# Patient Record
Sex: Male | Born: 1965
Health system: Southern US, Community
[De-identification: ages and names within clinical notes are randomized; demographics above are authoritative.]

## PROBLEM LIST (undated history)

## (undated) DIAGNOSIS — M109 Gout, unspecified: Secondary | ICD-10-CM

## (undated) DIAGNOSIS — G2581 Restless legs syndrome: Secondary | ICD-10-CM

## (undated) DIAGNOSIS — R7989 Other specified abnormal findings of blood chemistry: Secondary | ICD-10-CM

## (undated) DIAGNOSIS — D751 Secondary polycythemia: Secondary | ICD-10-CM

## (undated) DIAGNOSIS — E785 Hyperlipidemia, unspecified: Secondary | ICD-10-CM

## (undated) DIAGNOSIS — K603 Anal fistula, unspecified: Secondary | ICD-10-CM

## (undated) DIAGNOSIS — Z8739 Personal history of other diseases of the musculoskeletal system and connective tissue: Secondary | ICD-10-CM

## (undated) DIAGNOSIS — M722 Plantar fascial fibromatosis: Secondary | ICD-10-CM

## (undated) DIAGNOSIS — E291 Testicular hypofunction: Secondary | ICD-10-CM

## (undated) HISTORY — PX: CHOLECYSTECTOMY: SHX55

## (undated) HISTORY — DX: Hyperlipidemia, unspecified: E78.5

## (undated) HISTORY — DX: Secondary polycythemia: D75.1

---

## 1999-04-20 HISTORY — PX: APPENDECTOMY: SHX54

## 2001-08-08 ENCOUNTER — Ambulatory Visit (HOSPITAL_BASED_OUTPATIENT_CLINIC_OR_DEPARTMENT_OTHER): Admission: RE | Admit: 2001-08-08 | Discharge: 2001-08-08 | Payer: Self-pay | Admitting: Orthopedic Surgery

## 2012-01-24 ENCOUNTER — Emergency Department (HOSPITAL_BASED_OUTPATIENT_CLINIC_OR_DEPARTMENT_OTHER)
Admission: EM | Admit: 2012-01-24 | Discharge: 2012-01-24 | Disposition: A | Discharge status: Home / Self Care | Payer: Worker's Compensation | Attending: Emergency Medicine | Admitting: Emergency Medicine

## 2012-01-24 ENCOUNTER — Emergency Department (HOSPITAL_BASED_OUTPATIENT_CLINIC_OR_DEPARTMENT_OTHER): Payer: Worker's Compensation

## 2012-01-24 ENCOUNTER — Encounter (HOSPITAL_BASED_OUTPATIENT_CLINIC_OR_DEPARTMENT_OTHER): Payer: Self-pay | Admitting: Student

## 2012-01-24 DIAGNOSIS — S0180XA Unspecified open wound of other part of head, initial encounter: Secondary | ICD-10-CM | POA: Insufficient documentation

## 2012-01-24 DIAGNOSIS — Y9289 Other specified places as the place of occurrence of the external cause: Secondary | ICD-10-CM | POA: Insufficient documentation

## 2012-01-24 DIAGNOSIS — Z23 Encounter for immunization: Secondary | ICD-10-CM | POA: Insufficient documentation

## 2012-01-24 DIAGNOSIS — S0181XA Laceration without foreign body of other part of head, initial encounter: Secondary | ICD-10-CM

## 2012-01-24 DIAGNOSIS — T148XXA Other injury of unspecified body region, initial encounter: Secondary | ICD-10-CM

## 2012-01-24 DIAGNOSIS — IMO0002 Reserved for concepts with insufficient information to code with codable children: Secondary | ICD-10-CM | POA: Insufficient documentation

## 2012-01-24 HISTORY — DX: Gout, unspecified: M10.9

## 2012-01-24 MED ORDER — OXYCODONE-ACETAMINOPHEN 5-325 MG PO TABS
1.0000 | ORAL_TABLET | Freq: Once | ORAL | Status: AC
  Administered 2012-01-24: 1 via ORAL
  Filled 2012-01-24 (×2): qty 1

## 2012-01-24 MED ORDER — TETANUS-DIPHTH-ACELL PERTUSSIS 5-2.5-18.5 LF-MCG/0.5 IM SUSP
0.5000 mL | Freq: Once | INTRAMUSCULAR | Status: AC
  Administered 2012-01-24: 0.5 mL via INTRAMUSCULAR
  Filled 2012-01-24: qty 0.5

## 2012-01-24 NOTE — ED Notes (Signed)
Pt reports being at work and getting accidentally hit in the face with a work related object. Has a laceration approximately 1/2 in in length with no active bleeding. Denies visual problems at this time. Neuro intact.

## 2012-01-24 NOTE — ED Notes (Addendum)
[employer] - [employer] UDS performed. Pt reports being hit in head by work machinery, no loc. Skin abrasion noted at upper right eyebrow

## 2012-01-24 NOTE — ED Provider Notes (Addendum)
History     CSN: [id number]  Arrival date & time 01/24/12  1016   First MD Initiated Contact with Patient 01/24/12 1056      Chief Complaint  Patient presents with  . Abrasion    (Consider location/radiation/quality/duration/timing/severity/associated sxs/prior treatment) Patient is a 46 y.o. male presenting with facial injury. The history is provided by the patient.  Facial Injury  The incident occurred just prior to arrival. The injury mechanism was a direct blow. 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 No protective equipment was used. He came to the ER via personal transport. There is an injury to the face. The pain is severe. It is unlikely that a foreign body is present. Associated symptoms include nausea and headaches. Pertinent negatives include no visual disturbance, no vomiting, no light-headedness and no loss of consciousness. There have been no prior injuries to these areas. His tetanus status is unknown. He has been behaving normally. There were no sick contacts. He has received no recent medical care.    Past Medical History  Diagnosis Date  . Gout     No past surgical history on file.  No family history on file.  History  Substance Use Topics  . Smoking status: Never Smoker   . Smokeless tobacco: Not on file  . Alcohol Use: No      Review of Systems  Eyes: Negative for visual disturbance.  Gastrointestinal: Positive for nausea. Negative for vomiting.  Neurological: Positive for headaches. Negative for loss of consciousness and light-headedness.  All other systems reviewed and are negative.    Allergies  Penicillins  Home Medications  No current outpatient prescriptions on file.  There were no vitals taken for this visit.  Physical Exam  Nursing note and vitals reviewed. Constitutional: He is oriented to person, place, and time. He appears well-developed and well-nourished. No distress.  HENT:  Head:  Normocephalic. Head is with contusion and with laceration. Head is without right periorbital erythema.    Mouth/Throat: Oropharynx is clear and moist.  Eyes: Conjunctivae normal and EOM are normal. Pupils are equal, round, and reactive to light.       Normal vision  Neck: Normal range of motion. Neck supple. No spinous process tenderness and no muscular tenderness present.  Cardiovascular: Normal rate, regular rhythm and intact distal pulses.   No murmur heard. Pulmonary/Chest: Effort normal and breath sounds normal. No respiratory distress. He has no wheezes. He has no rales.  Musculoskeletal: Normal range of motion. He exhibits no edema and no tenderness.  Neurological: He is alert and oriented to person, place, and time.  Skin: Skin is warm and dry. No rash noted. No erythema.  Psychiatric: He has a normal mood and affect. His behavior is normal.    ED Course  Procedures (including critical care time)  Labs Reviewed - No data to display Ct Head Wo Contrast  01/24/2012  *RADIOLOGY REPORT*  Clinical Data:  Head trauma, dizziness, blurred vision, nausea, struck right supraorbital  CT HEAD WITHOUT CONTRAST CT MAXILLOFACIAL WITHOUT CONTRAST  Technique:  Multidetector CT imaging of the head and maxillofacial structures were performed using the standard protocol without intravenous contrast. Multiplanar CT image reconstructions of the maxillofacial structures were also generated.  Comparison:  None  CT HEAD  Findings: Normal ventricular morphology. No midline shift or mass effect. Normal appearance of brain parenchyma. No intracranial hemorrhage, mass lesion or evidence of acute infarction. Right supraorbital scalp hematoma. No  extra-axial fluid collections or fractures identified.  IMPRESSION: No acute intracranial abnormalities.  CT MAXILLOFACIAL  Findings: No markers on right side of face. Visualized intracranial structures unremarkable. Intraorbital soft tissue planes clear. Right periorbital  and supraorbital scalp hematoma. Paranasal sinuses, mastoid air cells and middle ear cavities clear. Nasal septal deviation to the left. No facial bone or calvarial fracture identified.  IMPRESSION: No acute osseous abnormalities.   Original Report Authenticated By: Lollie Marrow, M.D.    Ct Maxillofacial Wo Cm  01/24/2012  *RADIOLOGY REPORT*  Clinical Data:  Head trauma, dizziness, blurred vision, nausea, struck right supraorbital  CT HEAD WITHOUT CONTRAST CT MAXILLOFACIAL WITHOUT CONTRAST  Technique:  Multidetector CT imaging of the head and maxillofacial structures were performed using the standard protocol without intravenous contrast. Multiplanar CT image reconstructions of the maxillofacial structures were also generated.  Comparison:  None  CT HEAD  Findings: Normal ventricular morphology. No midline shift or mass effect. Normal appearance of brain parenchyma. No intracranial hemorrhage, mass lesion or evidence of acute infarction. Right supraorbital scalp hematoma. No extra-axial fluid collections or fractures identified.  IMPRESSION: No acute intracranial abnormalities.  CT MAXILLOFACIAL  Findings: No markers on right side of face. Visualized intracranial structures unremarkable. Intraorbital soft tissue planes clear. Right periorbital and supraorbital scalp hematoma. Paranasal sinuses, mastoid air cells and middle ear cavities clear. Nasal septal deviation to the left. No facial bone or calvarial fracture identified.  IMPRESSION: No acute osseous abnormalities.   Original Report Authenticated By: Lollie Marrow, M.D.    LACERATION REPAIR Performed by: Gwyneth Sprout Authorized byGwyneth Sprout Consent: Verbal consent obtained. Risks and benefits: risks, benefits and alternatives were discussed Consent given by: patient Patient identity confirmed: provided demographic data Prepped and Draped in normal sterile fashion Wound explored  Laceration Location: Right eyebrow  Laceration  Length: 1 cm  No Foreign Bodies seen or palpated  Anesthesia: None Irrigation method: And scrubbed with tap water  Amount of cleaning: standard  Skin closure: Dermabond Number of sutures0  Technique: Dermabond   Patient tolerance: Patient tolerated the procedure well with no immediate complications.    1. Facial laceration   2. Contusion       MDM   Patient with an injury to the superior orbital rim after being hit by a piece of equipment at work. He had no LOC did not pass out.  He has had some persistent nausea but denies any visual acuity changes. There is a superficial lack over the right eyebrow. No signs of extraocular muscle entrapment. Tetanus updated. CT of the head and face pending to rule out fracture or underlying bleed. Patient is currently not on anticoagulation.  12:29 PM Films are negative. Wound repaired and patient discharged home.         Gwyneth Sprout, MD 01/24/12 1229  Gwyneth Sprout, MD 01/24/12 1230

## 2014-07-08 ENCOUNTER — Ambulatory Visit (INDEPENDENT_AMBULATORY_CARE_PROVIDER_SITE_OTHER): Payer: 59

## 2014-07-08 VITALS — BP 139/87 | HR 83 | Resp 18

## 2014-07-08 DIAGNOSIS — M79675 Pain in left toe(s): Secondary | ICD-10-CM

## 2014-07-08 DIAGNOSIS — L03032 Cellulitis of left toe: Secondary | ICD-10-CM

## 2014-07-08 DIAGNOSIS — L600 Ingrowing nail: Secondary | ICD-10-CM

## 2014-07-08 MED ORDER — CLINDAMYCIN HCL 150 MG PO CAPS: 150.0000 mg | ORAL_CAPSULE | Freq: Four times a day (QID) | ORAL | Status: DC

## 2014-07-08 NOTE — Patient Instructions (Signed)
Betadine Soak Instructions  Purchase an 8 oz. bottle of BETADINE solution (Povidone)  THE DAY AFTER THE PROCEDURE  Place 1 tablespoon of betadine solution in a quart of warm tap water.  Submerge your foot or feet with outer bandage intact for the initial soak; this will allow the bandage to become moist and wet for easy lift off.  Once you remove your bandage, continue to soak in the solution for 20 minutes.  This soak should be done twice a day.  Next, remove your foot or feet from solution, blot dry the affected area and cover.  You may use a band aid large enough to cover the area or use gauze and tape.  Apply other medications to the area as directed by the doctor such as cortisporin otic solution (ear drops) or neosporin.  IF YOUR SKIN BECOMES IRRITATED WHILE USING THESE INSTRUCTIONS, IT IS OKAY TO SWITCH TO EPSOM SALTS AND WATER OR WHITE VINEGAR AND WATER.  Recommend Tylenol or Advil as needed for pain 2 Tylenol or 2 Advil up to 3 times a day should manage the pain

## 2014-07-08 NOTE — Progress Notes (Signed)
   Subjective:    Patient ID: Joshua [name], male    DOB: 09/20/1965, 49 y.o.   MRN: [id number]  HPI I HAVE AN INGROWN TOENAIL ON MY LEFT BIG TOE AND HAS BEEN GOING ON FOR ABOUT 2 MONTHS AND HAS TRIMMED ON IT AND TOUCHY AND SMELLS AND HAS SOME DRAINING AND IS SORE AND TENDER AND I DRIVE A TRUCK    Review of Systems  All other systems reviewed and are negative.      Objective:   Physical Exam 49 year old white male well-developed well-nourished oriented 3 presents this time with long-standing history of ingrowing nail lateral border of the left great toe maintain correction nail procedure on the medial border sometime in the past. Has a repeated recurrent ingrowing nails on both of his toes however the lateral left is not painful symptomatically for more than 2 months there is some yellow discharge and drainage granulation tissue and edema the lateral nail fold. No ascending cellulitis or lymphangitis pedal pulses are palpable DP and PT +2 over 4 capillary fill time 3 seconds epicritic and proprioceptive sensations intact and symmetric bilateral there is normal plantar response and DTRs. Dermatologic the skin color pigment normal hair growth absent diminished distally there is slight rash his medial ankle yesterday about identifying skin cancers may just be some actinic keratosis however patient does have ingrowing nail left great toe some proptosis incurvation of the right hallux although a symptomatically the current time. Orthopedic biomechanical exam unremarkable rectus foot mild flexible digital contractures are noted no open wounds no ulcers no secondary infections the current time.       Assessment & Plan:  Assessment paronychia ingrowing nail left great toe lateral border plan at this time her condition permanent nail excision with phenol matricectomy local block diminished total of 3 mL 50-50 mix of 2% Xylocaine plain and 0.5 Marcaine plain the left great toe Betadine prep was performed a  lateral border is excised phenol matricectomy followed by alcohol wash Betadine ointment and a or Silvadene and a gauze dressing being applied to the left great toe. Patient is instructed in daily soaking Betadine warm water prescription for clean the mycin is issued time also pursue recommend Tylenol or Advil as needed for pain recheck in 2-3 weeks for follow-up and nail check.  Alvan Dameichard Camdyn Laden DPM

## 2014-07-22 ENCOUNTER — Ambulatory Visit: Payer: 59

## 2016-05-10 DIAGNOSIS — G4700 Insomnia, unspecified: Secondary | ICD-10-CM | POA: Diagnosis not present

## 2016-05-10 DIAGNOSIS — M109 Gout, unspecified: Secondary | ICD-10-CM | POA: Diagnosis not present

## 2016-05-10 DIAGNOSIS — E291 Testicular hypofunction: Secondary | ICD-10-CM | POA: Diagnosis not present

## 2016-08-02 DIAGNOSIS — R05 Cough: Secondary | ICD-10-CM | POA: Diagnosis not present

## 2016-12-06 DIAGNOSIS — Z0000 Encounter for general adult medical examination without abnormal findings: Secondary | ICD-10-CM | POA: Diagnosis not present

## 2016-12-06 DIAGNOSIS — Z23 Encounter for immunization: Secondary | ICD-10-CM | POA: Diagnosis not present

## 2016-12-06 DIAGNOSIS — G4700 Insomnia, unspecified: Secondary | ICD-10-CM | POA: Diagnosis not present

## 2016-12-06 DIAGNOSIS — M109 Gout, unspecified: Secondary | ICD-10-CM | POA: Diagnosis not present

## 2016-12-06 DIAGNOSIS — E291 Testicular hypofunction: Secondary | ICD-10-CM | POA: Diagnosis not present

## 2017-01-17 DIAGNOSIS — Z1211 Encounter for screening for malignant neoplasm of colon: Secondary | ICD-10-CM | POA: Diagnosis not present

## 2017-02-14 DIAGNOSIS — Z1211 Encounter for screening for malignant neoplasm of colon: Secondary | ICD-10-CM | POA: Diagnosis not present

## 2017-02-14 DIAGNOSIS — K6289 Other specified diseases of anus and rectum: Secondary | ICD-10-CM | POA: Diagnosis not present

## 2017-02-14 DIAGNOSIS — K635 Polyp of colon: Secondary | ICD-10-CM | POA: Diagnosis not present

## 2017-02-28 DIAGNOSIS — J329 Chronic sinusitis, unspecified: Secondary | ICD-10-CM | POA: Diagnosis not present

## 2017-02-28 DIAGNOSIS — E291 Testicular hypofunction: Secondary | ICD-10-CM | POA: Diagnosis not present

## 2017-02-28 DIAGNOSIS — Z23 Encounter for immunization: Secondary | ICD-10-CM | POA: Diagnosis not present

## 2017-05-24 DIAGNOSIS — R0789 Other chest pain: Secondary | ICD-10-CM | POA: Diagnosis not present

## 2017-05-24 DIAGNOSIS — E291 Testicular hypofunction: Secondary | ICD-10-CM | POA: Diagnosis not present

## 2017-08-15 DIAGNOSIS — R300 Dysuria: Secondary | ICD-10-CM | POA: Diagnosis not present

## 2017-10-10 DIAGNOSIS — E291 Testicular hypofunction: Secondary | ICD-10-CM | POA: Diagnosis not present

## 2017-10-10 DIAGNOSIS — R8290 Unspecified abnormal findings in urine: Secondary | ICD-10-CM | POA: Diagnosis not present

## 2017-10-10 DIAGNOSIS — R300 Dysuria: Secondary | ICD-10-CM | POA: Diagnosis not present

## 2017-10-10 DIAGNOSIS — N481 Balanitis: Secondary | ICD-10-CM | POA: Diagnosis not present

## 2017-10-25 DIAGNOSIS — R7989 Other specified abnormal findings of blood chemistry: Secondary | ICD-10-CM | POA: Diagnosis not present

## 2017-10-25 DIAGNOSIS — R361 Hematospermia: Secondary | ICD-10-CM | POA: Diagnosis not present

## 2017-10-25 DIAGNOSIS — Z136 Encounter for screening for cardiovascular disorders: Secondary | ICD-10-CM | POA: Diagnosis not present

## 2017-10-25 DIAGNOSIS — R319 Hematuria, unspecified: Secondary | ICD-10-CM | POA: Diagnosis not present

## 2017-10-25 DIAGNOSIS — Z5181 Encounter for therapeutic drug level monitoring: Secondary | ICD-10-CM | POA: Diagnosis not present

## 2018-01-02 DIAGNOSIS — R05 Cough: Secondary | ICD-10-CM | POA: Diagnosis not present

## 2018-01-02 DIAGNOSIS — E291 Testicular hypofunction: Secondary | ICD-10-CM | POA: Diagnosis not present

## 2018-01-02 DIAGNOSIS — G4700 Insomnia, unspecified: Secondary | ICD-10-CM | POA: Diagnosis not present

## 2018-06-16 DIAGNOSIS — E669 Obesity, unspecified: Secondary | ICD-10-CM | POA: Diagnosis not present

## 2018-06-16 DIAGNOSIS — R05 Cough: Secondary | ICD-10-CM | POA: Diagnosis not present

## 2018-06-16 DIAGNOSIS — J069 Acute upper respiratory infection, unspecified: Secondary | ICD-10-CM | POA: Diagnosis not present

## 2018-06-16 DIAGNOSIS — E291 Testicular hypofunction: Secondary | ICD-10-CM | POA: Diagnosis not present

## 2019-06-28 ENCOUNTER — Encounter (HOSPITAL_COMMUNITY)
Admission: EM | Disposition: A | Discharge status: Home / Self Care | Payer: Self-pay | Source: Home / Self Care | Attending: Emergency Medicine

## 2019-06-28 ENCOUNTER — Observation Stay (HOSPITAL_COMMUNITY): Payer: 59 | Admitting: Registered Nurse

## 2019-06-28 ENCOUNTER — Observation Stay (HOSPITAL_COMMUNITY)
Admission: EM | Admit: 2019-06-28 | Discharge: 2019-06-29 | Disposition: A | Discharge status: Home / Self Care | Payer: 59 | Attending: General Surgery | Admitting: General Surgery

## 2019-06-28 ENCOUNTER — Encounter (HOSPITAL_COMMUNITY): Payer: Self-pay

## 2019-06-28 ENCOUNTER — Emergency Department (HOSPITAL_COMMUNITY): Payer: 59

## 2019-06-28 ENCOUNTER — Other Ambulatory Visit: Payer: Self-pay

## 2019-06-28 DIAGNOSIS — I700 Atherosclerosis of aorta: Secondary | ICD-10-CM | POA: Diagnosis not present

## 2019-06-28 DIAGNOSIS — K611 Rectal abscess: Secondary | ICD-10-CM | POA: Diagnosis present

## 2019-06-28 DIAGNOSIS — Z9049 Acquired absence of other specified parts of digestive tract: Secondary | ICD-10-CM | POA: Insufficient documentation

## 2019-06-28 DIAGNOSIS — N200 Calculus of kidney: Secondary | ICD-10-CM | POA: Diagnosis not present

## 2019-06-28 DIAGNOSIS — Z79899 Other long term (current) drug therapy: Secondary | ICD-10-CM | POA: Insufficient documentation

## 2019-06-28 DIAGNOSIS — K760 Fatty (change of) liver, not elsewhere classified: Secondary | ICD-10-CM | POA: Insufficient documentation

## 2019-06-28 DIAGNOSIS — Z20822 Contact with and (suspected) exposure to covid-19: Secondary | ICD-10-CM | POA: Insufficient documentation

## 2019-06-28 DIAGNOSIS — K610 Anal abscess: Secondary | ICD-10-CM | POA: Diagnosis present

## 2019-06-28 DIAGNOSIS — J984 Other disorders of lung: Secondary | ICD-10-CM | POA: Insufficient documentation

## 2019-06-28 DIAGNOSIS — K612 Anorectal abscess: Principal | ICD-10-CM | POA: Insufficient documentation

## 2019-06-28 DIAGNOSIS — M109 Gout, unspecified: Secondary | ICD-10-CM | POA: Diagnosis not present

## 2019-06-28 DIAGNOSIS — Z6837 Body mass index (BMI) 37.0-37.9, adult: Secondary | ICD-10-CM | POA: Diagnosis not present

## 2019-06-28 DIAGNOSIS — Z880 Allergy status to penicillin: Secondary | ICD-10-CM | POA: Diagnosis not present

## 2019-06-28 DIAGNOSIS — F17210 Nicotine dependence, cigarettes, uncomplicated: Secondary | ICD-10-CM | POA: Diagnosis not present

## 2019-06-28 HISTORY — DX: Rectal abscess: K61.1

## 2019-06-28 HISTORY — DX: Other specified abnormal findings of blood chemistry: R79.89

## 2019-06-28 HISTORY — PX: INCISION AND DRAINAGE PERIRECTAL ABSCESS: SHX1804

## 2019-06-28 LAB — RESPIRATORY PANEL BY RT PCR (FLU A&B, COVID)
Influenza A by PCR: NEGATIVE
Influenza B by PCR: NEGATIVE
SARS Coronavirus 2 by RT PCR: NEGATIVE

## 2019-06-28 LAB — CBC WITH DIFFERENTIAL/PLATELET
Abs Immature Granulocytes: 0.1 10*3/uL — ABNORMAL HIGH (ref 0.00–0.07)
Basophils Absolute: 0.1 10*3/uL (ref 0.0–0.1)
Basophils Relative: 0 %
Eosinophils Absolute: 0.1 10*3/uL (ref 0.0–0.5)
Eosinophils Relative: 1 %
HCT: 50.8 % (ref 39.0–52.0)
Hemoglobin: 17.1 g/dL — ABNORMAL HIGH (ref 13.0–17.0)
Immature Granulocytes: 1 %
Lymphocytes Relative: 8 %
Lymphs Abs: 1.2 10*3/uL (ref 0.7–4.0)
MCH: 31.8 pg (ref 26.0–34.0)
MCHC: 33.7 g/dL (ref 30.0–36.0)
MCV: 94.6 fL (ref 80.0–100.0)
Monocytes Absolute: 1.4 10*3/uL — ABNORMAL HIGH (ref 0.1–1.0)
Monocytes Relative: 10 %
Neutro Abs: 12 10*3/uL — ABNORMAL HIGH (ref 1.7–7.7)
Neutrophils Relative %: 80 %
Platelets: 130 10*3/uL — ABNORMAL LOW (ref 150–400)
RBC: 5.37 MIL/uL (ref 4.22–5.81)
RDW: 13.2 % (ref 11.5–15.5)
WBC: 14.9 10*3/uL — ABNORMAL HIGH (ref 4.0–10.5)
nRBC: 0 % (ref 0.0–0.2)

## 2019-06-28 LAB — BASIC METABOLIC PANEL
Anion gap: 9 (ref 5–15)
BUN: 16 mg/dL (ref 6–20)
CO2: 23 mmol/L (ref 22–32)
Calcium: 8.6 mg/dL — ABNORMAL LOW (ref 8.9–10.3)
Chloride: 105 mmol/L (ref 98–111)
Creatinine, Ser: 1.06 mg/dL (ref 0.61–1.24)
GFR calc Af Amer: >60 mL/min (ref >60)
GFR calc non Af Amer: >60 mL/min (ref >60)
Glucose, Bld: 99 mg/dL (ref 70–99)
Potassium: 4.1 mmol/L (ref 3.5–5.1)
Sodium: 137 mmol/L (ref 135–145)

## 2019-06-28 LAB — HIV ANTIBODY (ROUTINE TESTING W REFLEX): HIV Screen 4th Generation wRfx: NONREACTIVE

## 2019-06-28 SURGERY — EXAM UNDER ANESTHESIA
Anesthesia: General

## 2019-06-28 MED ORDER — DIPHENHYDRAMINE HCL 25 MG PO CAPS: 25.0000 mg | ORAL_CAPSULE | Freq: Four times a day (QID) | ORAL | Status: DC | PRN

## 2019-06-28 MED ORDER — SODIUM CHLORIDE (PF) 0.9 % IJ SOLN
INTRAMUSCULAR | Status: AC
  Filled 2019-06-28: qty 50

## 2019-06-28 MED ORDER — OXYCODONE HCL 5 MG/5ML PO SOLN: 5.0000 mg | Freq: Once | ORAL | Status: DC | PRN

## 2019-06-28 MED ORDER — CLINDAMYCIN PHOSPHATE 600 MG/50ML IV SOLN
600.0000 mg | Freq: Three times a day (TID) | INTRAVENOUS | Status: DC
  Filled 2019-06-28: qty 50

## 2019-06-28 MED ORDER — DEXTROSE-NACL 5-0.45 % IV SOLN: INTRAVENOUS | Status: DC

## 2019-06-28 MED ORDER — ACETAMINOPHEN 500 MG PO TABS
1000.0000 mg | ORAL_TABLET | ORAL | Status: AC
  Administered 2019-06-28: 1000 mg via ORAL
  Filled 2019-06-28: qty 2

## 2019-06-28 MED ORDER — CLINDAMYCIN PHOSPHATE 900 MG/50ML IV SOLN
900.0000 mg | Freq: Three times a day (TID) | INTRAVENOUS | Status: DC
  Administered 2019-06-28 – 2019-06-29 (×2): 900 mg via INTRAVENOUS
  Filled 2019-06-28 (×3): qty 50

## 2019-06-28 MED ORDER — FENTANYL CITRATE (PF) 100 MCG/2ML IJ SOLN: 25.0000 ug | INTRAMUSCULAR | Status: DC | PRN

## 2019-06-28 MED ORDER — LIP MEDEX EX OINT
1.0000 "application " | TOPICAL_OINTMENT | Freq: Two times a day (BID) | CUTANEOUS | Status: DC
  Administered 2019-06-28 – 2019-06-29 (×2): 1 via TOPICAL
  Filled 2019-06-28: qty 7

## 2019-06-28 MED ORDER — PROPOFOL 500 MG/50ML IV EMUL
INTRAVENOUS | Status: DC | PRN
  Administered 2019-06-28: 200 mg via INTRAVENOUS

## 2019-06-28 MED ORDER — CLINDAMYCIN PHOSPHATE 900 MG/50ML IV SOLN
900.0000 mg | Freq: Four times a day (QID) | INTRAVENOUS | Status: DC
  Filled 2019-06-28 (×2): qty 50

## 2019-06-28 MED ORDER — CLINDAMYCIN PHOSPHATE 600 MG/50ML IV SOLN
600.0000 mg | Freq: Once | INTRAVENOUS | Status: AC
  Administered 2019-06-28: 600 mg via INTRAVENOUS
  Filled 2019-06-28: qty 50

## 2019-06-28 MED ORDER — LACTATED RINGERS IV SOLN: INTRAVENOUS | Status: DC

## 2019-06-28 MED ORDER — GENTAMICIN SULFATE 40 MG/ML IJ SOLN
7.0000 mg/kg | INTRAVENOUS | Status: DC
  Administered 2019-06-28: 640 mg via INTRAVENOUS
  Filled 2019-06-28 (×2): qty 16

## 2019-06-28 MED ORDER — 0.9 % SODIUM CHLORIDE (POUR BTL) OPTIME
TOPICAL | Status: DC | PRN
  Administered 2019-06-28: 1000 mL

## 2019-06-28 MED ORDER — ALLOPURINOL 100 MG PO TABS
100.0000 mg | ORAL_TABLET | Freq: Every day | ORAL | Status: DC
  Administered 2019-06-28: 100 mg via ORAL
  Filled 2019-06-28 (×2): qty 1

## 2019-06-28 MED ORDER — MIDAZOLAM HCL 2 MG/2ML IJ SOLN
INTRAMUSCULAR | Status: DC | PRN
  Administered 2019-06-28: 2 mg via INTRAVENOUS

## 2019-06-28 MED ORDER — FENTANYL CITRATE (PF) 250 MCG/5ML IJ SOLN
INTRAMUSCULAR | Status: AC
  Filled 2019-06-28: qty 5

## 2019-06-28 MED ORDER — PROPOFOL 10 MG/ML IV BOLUS
INTRAVENOUS | Status: AC
  Filled 2019-06-28: qty 40

## 2019-06-28 MED ORDER — TRAZODONE HCL 100 MG PO TABS
150.0000 mg | ORAL_TABLET | Freq: Every day | ORAL | Status: DC
  Administered 2019-06-28: 150 mg via ORAL
  Filled 2019-06-28: qty 1

## 2019-06-28 MED ORDER — ENOXAPARIN SODIUM 40 MG/0.4ML ~~LOC~~ SOLN: 40.0000 mg | SUBCUTANEOUS | Status: DC

## 2019-06-28 MED ORDER — SODIUM CHLORIDE 0.9 % IV SOLN: 20.0000 mL | Freq: Once | INTRAVENOUS | Status: DC

## 2019-06-28 MED ORDER — BUPIVACAINE LIPOSOME 1.3 % IJ SUSP
INTRAMUSCULAR | Status: DC | PRN
  Administered 2019-06-28: 20 mL

## 2019-06-28 MED ORDER — DIPHENHYDRAMINE HCL 50 MG/ML IJ SOLN: 25.0000 mg | Freq: Four times a day (QID) | INTRAMUSCULAR | Status: DC | PRN

## 2019-06-28 MED ORDER — IOHEXOL 300 MG/ML  SOLN
100.0000 mL | Freq: Once | INTRAMUSCULAR | Status: AC | PRN
  Administered 2019-06-28: 100 mL via INTRAVENOUS

## 2019-06-28 MED ORDER — PHENOL 1.4 % MT LIQD: 2.0000 | OROMUCOSAL | Status: DC | PRN

## 2019-06-28 MED ORDER — METHOCARBAMOL 1000 MG/10ML IJ SOLN
1000.0000 mg | Freq: Four times a day (QID) | INTRAVENOUS | Status: DC | PRN
  Filled 2019-06-28: qty 10

## 2019-06-28 MED ORDER — LACTATED RINGERS IV BOLUS: 1000.0000 mL | Freq: Three times a day (TID) | INTRAVENOUS | Status: DC | PRN

## 2019-06-28 MED ORDER — ONDANSETRON HCL 4 MG/2ML IJ SOLN: 4.0000 mg | Freq: Four times a day (QID) | INTRAMUSCULAR | Status: DC | PRN

## 2019-06-28 MED ORDER — BUPIVACAINE-EPINEPHRINE (PF) 0.25% -1:200000 IJ SOLN
INTRAMUSCULAR | Status: AC
  Filled 2019-06-28: qty 30

## 2019-06-28 MED ORDER — DOCUSATE SODIUM 100 MG PO CAPS
100.0000 mg | ORAL_CAPSULE | Freq: Two times a day (BID) | ORAL | Status: DC
  Administered 2019-06-28 – 2019-06-29 (×2): 100 mg via ORAL
  Filled 2019-06-28 (×2): qty 1

## 2019-06-28 MED ORDER — GENTAMICIN IN SALINE 1-0.9 MG/ML-% IV SOLN
100.0000 mg | INTRAVENOUS | Status: AC
  Administered 2019-06-28: 100 mg via INTRAVENOUS
  Filled 2019-06-28: qty 100

## 2019-06-28 MED ORDER — OXYCODONE HCL 5 MG PO TABS
5.0000 mg | ORAL_TABLET | ORAL | Status: DC | PRN
  Administered 2019-06-29: 10 mg via ORAL
  Filled 2019-06-28: qty 2

## 2019-06-28 MED ORDER — MORPHINE SULFATE (PF) 4 MG/ML IV SOLN
4.0000 mg | Freq: Once | INTRAVENOUS | Status: AC
  Administered 2019-06-28: 4 mg via INTRAVENOUS
  Filled 2019-06-28: qty 1

## 2019-06-28 MED ORDER — ALUM & MAG HYDROXIDE-SIMETH 200-200-20 MG/5ML PO SUSP: 30.0000 mL | Freq: Four times a day (QID) | ORAL | Status: DC | PRN

## 2019-06-28 MED ORDER — ONDANSETRON HCL 4 MG/2ML IJ SOLN
INTRAMUSCULAR | Status: DC | PRN
  Administered 2019-06-28: 4 mg via INTRAVENOUS

## 2019-06-28 MED ORDER — IBUPROFEN 400 MG PO TABS: 800.0000 mg | ORAL_TABLET | Freq: Four times a day (QID) | ORAL | Status: DC | PRN

## 2019-06-28 MED ORDER — MIDAZOLAM HCL 2 MG/2ML IJ SOLN
INTRAMUSCULAR | Status: AC
  Filled 2019-06-28: qty 2

## 2019-06-28 MED ORDER — ACETAMINOPHEN 500 MG PO TABS
1000.0000 mg | ORAL_TABLET | Freq: Four times a day (QID) | ORAL | Status: DC
  Administered 2019-06-28 – 2019-06-29 (×2): 1000 mg via ORAL
  Filled 2019-06-28 (×2): qty 2

## 2019-06-28 MED ORDER — OXYCODONE HCL 5 MG PO TABS: 5.0000 mg | ORAL_TABLET | Freq: Once | ORAL | Status: DC | PRN

## 2019-06-28 MED ORDER — MENTHOL 3 MG MT LOZG: 1.0000 | LOZENGE | OROMUCOSAL | Status: DC | PRN

## 2019-06-28 MED ORDER — BUPIVACAINE-EPINEPHRINE 0.25% -1:200000 IJ SOLN
INTRAMUSCULAR | Status: DC | PRN
  Administered 2019-06-28: 20 mL

## 2019-06-28 MED ORDER — DEXAMETHASONE SODIUM PHOSPHATE 10 MG/ML IJ SOLN
INTRAMUSCULAR | Status: DC | PRN
  Administered 2019-06-28: 10 mg via INTRAVENOUS

## 2019-06-28 MED ORDER — ACETAMINOPHEN 10 MG/ML IV SOLN: 1000.0000 mg | Freq: Once | INTRAVENOUS | Status: DC | PRN

## 2019-06-28 MED ORDER — LIDOCAINE HCL (PF) 1 % IJ SOLN
30.0000 mL | Freq: Once | INTRAMUSCULAR | Status: AC
  Administered 2019-06-28: 30 mL
  Filled 2019-06-28: qty 30

## 2019-06-28 MED ORDER — ONDANSETRON HCL 4 MG/2ML IJ SOLN
4.0000 mg | Freq: Once | INTRAMUSCULAR | Status: AC
  Administered 2019-06-28: 4 mg via INTRAVENOUS
  Filled 2019-06-28: qty 2

## 2019-06-28 MED ORDER — METOPROLOL TARTRATE 5 MG/5ML IV SOLN: 5.0000 mg | Freq: Four times a day (QID) | INTRAVENOUS | Status: DC | PRN

## 2019-06-28 MED ORDER — SIMETHICONE 40 MG/0.6ML PO SUSP
40.0000 mg | Freq: Four times a day (QID) | ORAL | Status: DC | PRN
  Filled 2019-06-28: qty 0.6

## 2019-06-28 MED ORDER — BUPIVACAINE LIPOSOME 1.3 % IJ SUSP
20.0000 mL | Freq: Once | INTRAMUSCULAR | Status: DC
  Filled 2019-06-28: qty 20

## 2019-06-28 MED ORDER — GABAPENTIN 300 MG PO CAPS
300.0000 mg | ORAL_CAPSULE | ORAL | Status: AC
  Administered 2019-06-28: 300 mg via ORAL
  Filled 2019-06-28: qty 1

## 2019-06-28 MED ORDER — PROMETHAZINE HCL 25 MG/ML IJ SOLN: 6.2500 mg | INTRAMUSCULAR | Status: DC | PRN

## 2019-06-28 MED ORDER — POLYETHYLENE GLYCOL 3350 17 G PO PACK
17.0000 g | PACK | Freq: Every day | ORAL | Status: DC
  Administered 2019-06-29: 17 g via ORAL
  Filled 2019-06-28: qty 1

## 2019-06-28 MED ORDER — LIDOCAINE 2% (20 MG/ML) 5 ML SYRINGE
INTRAMUSCULAR | Status: DC | PRN
  Administered 2019-06-28: 100 mg via INTRAVENOUS

## 2019-06-28 MED ORDER — MAGIC MOUTHWASH
15.0000 mL | Freq: Four times a day (QID) | ORAL | Status: DC | PRN
  Filled 2019-06-28: qty 15

## 2019-06-28 MED ORDER — NAPROXEN 250 MG PO TABS
500.0000 mg | ORAL_TABLET | Freq: Two times a day (BID) | ORAL | Status: DC
  Administered 2019-06-29: 500 mg via ORAL
  Filled 2019-06-28: qty 2

## 2019-06-28 MED ORDER — FENTANYL CITRATE (PF) 100 MCG/2ML IJ SOLN
INTRAMUSCULAR | Status: DC | PRN
  Administered 2019-06-28 (×5): 50 ug via INTRAVENOUS

## 2019-06-28 MED ORDER — ONDANSETRON HCL 4 MG/2ML IJ SOLN
INTRAMUSCULAR | Status: AC
  Filled 2019-06-28: qty 2

## 2019-06-28 MED ORDER — ONDANSETRON 4 MG PO TBDP: 4.0000 mg | ORAL_TABLET | Freq: Four times a day (QID) | ORAL | Status: DC | PRN

## 2019-06-28 MED ORDER — ACETAMINOPHEN 325 MG PO TABS: 325.0000 mg | ORAL_TABLET | Freq: Four times a day (QID) | ORAL | Status: DC | PRN

## 2019-06-28 SURGICAL SUPPLY — 44 items
APL SKNCLS STERI-STRIP NONHPOA (GAUZE/BANDAGES/DRESSINGS) ×1
BENZOIN TINCTURE PRP APPL 2/3 (GAUZE/BANDAGES/DRESSINGS) ×2 IMPLANT
BLADE SURG 15 STRL LF DISP TIS (BLADE) ×1 IMPLANT
BLADE SURG 15 STRL SS (BLADE) ×2
BRIEF STRETCH FOR OB PAD LRG (UNDERPADS AND DIAPERS) ×2 IMPLANT
CNTNR URN SCR LID CUP LEK RST (MISCELLANEOUS) ×1 IMPLANT
CONT SPEC 4OZ STRL OR WHT (MISCELLANEOUS) ×2
COVER SURGICAL LIGHT HANDLE (MISCELLANEOUS) ×2 IMPLANT
COVER WAND RF STERILE (DRAPES) IMPLANT
DECANTER SPIKE VIAL GLASS SM (MISCELLANEOUS) ×2 IMPLANT
DRAPE LAPAROTOMY T 102X78X121 (DRAPES) ×2 IMPLANT
DRSG PAD ABDOMINAL 8X10 ST (GAUZE/BANDAGES/DRESSINGS) ×2 IMPLANT
ELECT REM PT RETURN 15FT ADLT (MISCELLANEOUS) ×2 IMPLANT
GAUZE 4X4 16PLY RFD (DISPOSABLE) ×2 IMPLANT
GAUZE PACKING IODOFORM 1/2 (PACKING) ×1 IMPLANT
GAUZE SPONGE 4X4 12PLY STRL (GAUZE/BANDAGES/DRESSINGS) ×2 IMPLANT
GLOVE ECLIPSE 8.0 STRL XLNG CF (GLOVE) ×2 IMPLANT
GLOVE INDICATOR 8.0 STRL GRN (GLOVE) ×2 IMPLANT
GOWN STRL REUS W/TWL XL LVL3 (GOWN DISPOSABLE) ×4 IMPLANT
KIT BASIN OR (CUSTOM PROCEDURE TRAY) ×2 IMPLANT
KIT TURNOVER KIT A (KITS) IMPLANT
LOOP VESSEL MAXI BLUE (MISCELLANEOUS) IMPLANT
NDL SAFETY ECLIPSE 18X1.5 (NEEDLE) IMPLANT
NEEDLE HYPO 18GX1.5 SHARP (NEEDLE) ×4
NEEDLE HYPO 22GX1.5 SAFETY (NEEDLE) ×2 IMPLANT
PACK BASIC VI WITH GOWN DISP (CUSTOM PROCEDURE TRAY) ×2 IMPLANT
PENCIL SMOKE EVACUATOR (MISCELLANEOUS) IMPLANT
SHEARS HARMONIC 9CM CVD (BLADE) IMPLANT
SUCTION FRAZIER HANDLE 12FR (TUBING)
SUCTION TUBE FRAZIER 12FR DISP (TUBING) IMPLANT
SURGILUBE 2OZ TUBE FLIPTOP (MISCELLANEOUS) ×2 IMPLANT
SUT CHROMIC 2 0 SH (SUTURE) ×2 IMPLANT
SUT CHROMIC 3 0 SH 27 (SUTURE) IMPLANT
SUT VIC AB 2-0 SH 27 (SUTURE)
SUT VIC AB 2-0 SH 27X BRD (SUTURE) IMPLANT
SUT VIC AB 2-0 UR6 27 (SUTURE) ×12 IMPLANT
SWAB COLLECTION DEVICE MRSA (MISCELLANEOUS) IMPLANT
SWAB CULTURE ESWAB REG 1ML (MISCELLANEOUS) IMPLANT
SYR 20ML LL LF (SYRINGE) ×2 IMPLANT
SYR 3ML LL SCALE MARK (SYRINGE) IMPLANT
SYR BULB IRRIGATION 50ML (SYRINGE) IMPLANT
TOWEL OR 17X26 10 PK STRL BLUE (TOWEL DISPOSABLE) ×2 IMPLANT
TOWEL OR NON WOVEN STRL DISP B (DISPOSABLE) ×2 IMPLANT
YANKAUER SUCT BULB TIP 10FT TU (MISCELLANEOUS) ×2 IMPLANT

## 2019-06-28 NOTE — Anesthesia Preprocedure Evaluation (Signed)
Anesthesia Evaluation  Patient identified by MRN, date of birth, ID band Patient awake    Reviewed: Allergy & Precautions, NPO status , Patient's Chart, lab work & pertinent test results  Airway Mallampati: II  TM Distance: >3 FB Neck ROM: Full    Dental no notable dental hx.    Pulmonary neg pulmonary ROS,    Pulmonary exam normal breath sounds clear to auscultation       Cardiovascular negative cardio ROS Normal cardiovascular exam Rhythm:Regular Rate:Normal     Neuro/Psych negative neurological ROS  negative psych ROS   GI/Hepatic negative GI ROS, Neg liver ROS,   Endo/Other  Morbid obesity  Renal/GU negative Renal ROS  negative genitourinary   Musculoskeletal negative musculoskeletal ROS (+)   Abdominal   Peds negative pediatric ROS (+)  Hematology negative hematology ROS (+)   Anesthesia Other Findings   Reproductive/Obstetrics negative OB ROS                            Anesthesia Physical Anesthesia Plan  ASA: II  Anesthesia Plan: General   Post-op Pain Management:    Induction: Intravenous  PONV Risk Score and Plan: 2 and Ondansetron, Dexamethasone and Treatment may vary due to age or medical condition  Airway Management Planned: Oral ETT  Additional Equipment:   Intra-op Plan:   Post-operative Plan: Extubation in OR  Informed Consent: I have reviewed the patients History and Physical, chart, labs and discussed the procedure including the risks, benefits and alternatives for the proposed anesthesia with the patient or authorized representative who has indicated his/her understanding and acceptance.   Dental advisory given  Plan Discussed with: CRNA and Surgeon  Anesthesia Plan Comments:         Anesthesia Quick Evaluation  

## 2019-06-28 NOTE — Discharge Instructions (Signed)
ANORECTAL SURGERY:  POST OPERATIVE INSTRUCTIONS  ######################################################################  EAT Start with a pureed / full liquid diet After 24 hours, gradually transition to a high fiber diet.    CONTROL PAIN Control pain so you can tolerate bowel movements,  walk, sleep, tolerate sneezing/coughing, and go up/down stairs.   HAVE A BOWEL MOVEMENT DAILY Keep your bowels regular to avoid problems.   Taking a fiber supplement every day to keep bowels soft.   Try a laxative to override constipation. Use an antidairrheal to slow down diarrhea.   Call if not better after 2 tries  WALK Walk an hour a day.  Control your pain to do that.   CALL IF YOU HAVE PROBLEMS/CONCERNS Call if you are still struggling despite following these instructions. Call if you have concerns not answered by these instructions  ######################################################################    1. Take your usually prescribed home medications unless otherwise directed.  2. DIET: Follow a light bland diet & liquids the first 24 hours after arrival home, such as soup, liquids, starches, etc.  Be sure to drink plenty of fluids.  Quickly advance to a usual solid diet within a few days.  Avoid fast food or heavy meals as your are more likely to get nauseated or have irregular bowels.  A low-fat, high-fiber diet for the rest of your life is ideal.  3. PAIN CONTROL: a. Pain is best controlled by a usual combination of three different methods TOGETHER: i. Ice/Heat ii. Over the counter pain medication iii. Prescription pain medication b. Expect swelling and discomfort in the anus/rectal area.  Warm water baths (30-60 minutes up to 6 times a day, especially after bowel meovements) will help. Use ice for the first few days to help decrease swelling and bruising, then switch to heat such as warm towels, sitz baths, warm baths, etc to help relax tight/sore spots and speed recovery.   Some people prefer to use ice alone, heat alone, alternating between ice & heat.  Experiment to what works for you.   c. It is helpful to take an over-the-counter pain medication continuously for the first few weeks.  Choose one of the following that works best for you: i. Naproxen (Aleve, etc)  Two 220mg tabs twice a day ii. Ibuprofen (Advil, etc) Three 200mg tabs four times a day (every meal & bedtime) iii. Acetaminophen (Tylenol, etc) 500-650mg four times a day (every meal & bedtime) d. A  prescription for pain medication (such as oxycodone, hydrocodone, etc) should be given to you upon discharge.  Take your pain medication as prescribed.  i. If you are having problems/concerns with the prescription medicine (does not control pain, nausea, vomiting, rash, itching, etc), please call us [phone] to see if we need to switch you to a different pain medicine that will work better for you and/or control your side effect better. ii. If you need a refill on your pain medication, please contact your pharmacy.  They will contact our office to request authorization. Prescriptions will not be filled after 5 pm or on week-ends.  If can take up to 48 hours for it to be filled & ready so avoid waiting until you are down to thel ast pill. e. A topical cream (Dibucaine) or a prescription for a cream (such as diltiazem 2% gel) may be given to you.  Many people find relief with topical creams.  Some people find it burns too much.  Experiment.  If it helps, use it.  If it burns, don't using   it.  Use a Sitz Bath 4-8 times a day for relief   Sitz Bath A sitz bath is a warm water bath taken in the sitting position that covers only the hips and buttocks. It may be used for either healing or hygiene purposes. Sitz baths are also used to relieve pain, itching, or muscle spasms. The water may contain medicine. Moist heat will help you heal and relax.  HOME CARE INSTRUCTIONS  Take 3 to 4 sitz baths a day. 1. Fill the  bathtub half full with warm water. 2. Sit in the water and open the drain a little. 3. Turn on the warm water to keep the tub half full. Keep the water running constantly. 4. Soak in the water for 15 to 20 minutes. 5. After the sitz bath, pat the affected area dry first.   4. KEEP YOUR BOWELS REGULAR a. The goal is one soft bowel movement a day b. Avoid getting constipated.  Between the surgery and the pain medications, it is common to experience some constipation.  Increasing fluid intake and taking a fiber supplement (such as Metamucil, Citrucel, FiberCon, MiraLax, etc) 2-3 times a day regularly will usually help prevent this problem from occurring.  A mild laxative (prune juice, Milk of Magnesia, MiraLax, etc) should be taken according to package directions if there are no bowel movements after 48 hours. c. Watch out for diarrhea.  If you have many loose bowel movements, simplify your diet to bland foods & liquids for a few days.  Stop any stool softeners and decrease your fiber supplement.  Switching to mild anti-diarrheal medications (Kayopectate, Pepto Bismol) can help.  Can try an imodium/loperamide dose.  If this worsens or does not improve, please call us.  5. Wound Care  a. Remove your bandages with your first bowel movement, usually the day after surgery.  You may have packing if you had an abscess.  Let any packing or gauze fall come out.   b. Wear an absorbent pad or soft cotton balls in your underwear as needed to catch any drainage and help keep the area  c. Keep the area clean and dry.  Bathe / shower every day.  Keep the area clean by showering / bathing over the incision / wound.   It is okay to soak an open wound to help wash it.  Consider using a squeeze bottle filled with warm water to gently wash the anal area.  Wet wipes or showers / gentle washing after bowel movements is often less traumatic than regular toilet paper. d. You will often notice bleeding with bowel movements.   This should slow down by the end of the first week of surgery.  Sitting on an ice pack can help. e. Expect some drainage.  This should slow down by the end of the first week of surgery, but you will have occasional bleeding or drainage up to a few months after surgery.  Wear an absorbent pad or soft cotton gauze in your underwear until the drainage stops.  6. ACTIVITIES as tolerated:   a. You may resume regular (light) daily activities beginning the next day--such as daily self-care, walking, climbing stairs--gradually increasing activities as tolerated.  If you can walk 30 minutes without difficulty, it is safe to try more intense activity such as jogging, treadmill, bicycling, low-impact aerobics, swimming, etc. b. Save the most intensive and strenuous activity for last such as sit-ups, heavy lifting, contact sports, etc  Refrain from any heavy lifting or straining   until you are off narcotics for pain control.   c. DO NOT PUSH THROUGH PAIN.  Let pain be your guide: If it hurts to do something, don't do it.  Pain is your body warning you to avoid that activity for another week until the pain goes down. d. You may drive when you are no longer taking prescription pain medication, you can comfortably sit for long periods of time, and you can safely maneuver your car and apply brakes. e. You may have sexual intercourse when it is comfortable.  7. FOLLOW UP in our office a. Please call [facility] at [phone] to set up an appointment to see your surgeon in the office for a follow-up appointment approximately 2-3 weeks after your surgery. b. Make sure that you call for this appointment the day you arrive home to ensure a convenient appointment time.  8. IF YOU HAVE DISABILITY OR FAMILY LEAVE FORMS, BRING THEM TO THE OFFICE FOR PROCESSING.  DO NOT GIVE THEM TO YOUR DOCTOR.        WHEN TO CALL US [phone]: 1. Poor pain control 2. Reactions / problems with new medications (rash/itching, nausea,  etc)  3. Fever over 101.5 F (38.5 C) 4. Inability to urinate 5. Nausea and/or vomiting 6. Worsening swelling or bruising 7. Continued bleeding from incision. 8. Increased pain, redness, or drainage from the incision  The clinic staff is available to answer your questions during regular business hours (8:30am-5pm).  Please don't hesitate to call and ask to speak to one of our nurses for clinical concerns.   A surgeon from [facility] is always on call at the hospitals   If you have a medical emergency, go to the nearest emergency room or call 911.    [facility] [address], [address], [place], [place]  [address] ? MAIN: [phone] ? TOLL FREE: [phone] ? FAX [phone] [email or web address]      Anorectal Abscess An abscess is an infected area that contains a collection of pus. An anorectal abscess is an abscess that is near the opening of the anus or around the rectum. Without treatment, an anorectal abscess can become larger and cause other problems, such as a more serious body-wide infection or pain, especially during bowel movements. What are the causes? This condition is caused by plugged glands or an infection in one of these areas:  The anus.  The area between the anus and the scrotum in males or between the anus and the vagina in females (perineum). What increases the risk? The following factors may make you more likely to develop this condition:  Diabetes or inflammatory bowel disease.  Having a body defense system (immune system) that is weak.  Engaging in anal sex.  Having a sexually transmitted infection (STI).  Certain kinds of cancer, such as rectal carcinoma, leukemia, or lymphoma. What are the signs or symptoms? The main symptom of this condition is pain. The pain may be a throbbing pain that gets worse during bowel movements. Other symptoms include:  Swelling and redness in the area of the  abscess. The redness may go beyond the abscess and appear as a red streak on the skin.  A visible, painful lump, or a lump that can be felt when touched.  Bleeding or pus-like discharge from the area.  Fever.  General weakness.  Constipation.  Diarrhea. How is this diagnosed? This condition is diagnosed based on your medical history and a physical exam of the   affected area.  This may involve examining the rectal area with a gloved hand (digital rectal exam).  Sometimes, the health care provider needs to look into the rectum using a probe, scope, or imaging test.  For women, it may require a careful vaginal exam. How is this treated? Treatment for this condition may include:  Incision and drainage surgery. This involves making an incision over the abscess to drain the pus.  Medicines, including antibiotic medicine, pain medicine, stool softeners, or laxatives. Follow these instructions at home: Medicines  Take over-the-counter and prescription medicines only as told by your health care provider.  If you were prescribed an antibiotic medicine, use it as told by your health care provider. Do not stop using the antibiotic even if you start to feel better.  Do not drive or use heavy machinery while taking prescription pain medicine. Wound care   If gauze was used in the abscess, follow instructions from your health care provider about removing or changing the gauze. It can usually be removed in 2-3 days.  Wash your hands with soap and water before you remove or change your gauze. If soap and water are not available, use hand sanitizer.  If one or more drains were placed in the abscess cavity, be careful not to pull at them. Your health care provider will tell you how long they need to remain in place.  Check your incision area every day for signs of infection. Check for: ? More redness, swelling, or pain. ? More fluid or blood. ? Warmth. ? Pus or a bad smell. Managing  pain, stiffness, and swelling   Take a sitz bath 3-4 times a day and after bowel movements. This will help reduce pain and swelling.  To relieve pain, try sitting: ? On a heating pad with the setting on low. ? On an inflatable donut-shaped cushion.  If directed, put ice on the affected area: ? Put ice in a plastic bag. ? Place a towel between your skin and the bag. ? Leave the ice on for 20 minutes, 2-3 times a day. General instructions  Follow any diet instructions given by your health care provider.  Keep all follow-up visits as told by your health care provider. This is important. Contact a health care provider if you have:  Bleeding from your incision.  Pain, swelling, or redness that does not improve or gets worse.  Trouble passing stool or urine.  Symptoms that return after treatment. Get help right away if you:  Have problems moving or using your legs.  Have severe or increasing pain.  Have swelling in the affected area that suddenly gets worse.  Have a large increase in bleeding or passing of pus.  Develop chills or a fever. Summary  An anorectal abscess is an abscess that is near the opening of the anus or around the rectum. An abscess is an infected area that contains a collection of pus.  The main symptom of this condition is pain. It may be a throbbing pain that gets worse during bowel movements.  Treatment for an anorectal abscess may include surgery to drain the pus from the abscess. Medicines and sitz baths may also be a part of your treatment plan. This information is not intended to replace advice given to you by your health care provider. Make sure you discuss any questions you have with your health care provider. Document Revised: 05/12/2017 Document Reviewed: 05/12/2017 Elsevier Patient Education  2020 [employer]  

## 2019-06-28 NOTE — H&P (Signed)
Joshua [name] [birth year]  [id number].    Requesting MD: [name] Chief Complaint/Reason for Consult: Perirectal abscess  HPI:  This is a 54 year old white male with a history of gout who was doing a lot of heavy lifting rolling side over the weekend.  He states that he felt like his buttocks became raw during this time.  On Monday he began having worsening perirectal pain.  He felt a lot of pressure in this area.  Nothing seemed to help the pain.  He went finally yesterday and saw his primary care physician who felt like he may have a abscess deep in the tissue.  He ordered oral clindamycin for the patient.  The patient has taken 3 doses of this.  He states since that visit he feels like this has significantly increased in size and pressure.  He has had some chills but no fevers.  Due to worsening pain, the patient presented to the emergency department today for evaluation.  He underwent an attempted incision and drainage in the ER with no success.  He has a CT scan of the pelvis that reveals a perirectal abscess.  The patient has an elevated white blood cell count around 15,000.  The rest of his labs are normal.  He denies any other complaints.  He is a [employer] and has been on a long haul to [place] this week.  He does state that he has had difficulty moving his bowels secondary to pain.  Because of these findings we have been asked to evaluate the patient for further evaluation and recommendations.  ROS: ROS: Please see HPI, otherwise all other systems have been reviewed and are negative.  Family History  Problem Relation Age of Onset  . Heart failure Mother   . Diabetes Mother   . Cancer Father   . Diabetes Father     Past Medical History:  Diagnosis Date  . Gout   . Gout   . Low testosterone     Past Surgical History:  Procedure Laterality Date  . APPENDECTOMY    . CHOLECYSTECTOMY      Social History:  has an unknown smoking status. His smokeless tobacco  use includes snuff. He reports current alcohol use. He reports that he does not use drugs.  Allergies:  Allergies  Allergen Reactions  . Penicillins     (Not in a hospital admission)    Physical Exam: Blood pressure (!) 154/94, pulse 91, temperature 98.1 F (36.7 C), temperature source Oral, resp. rate 18, height 5\' 10"  (1.778 m), weight 117.9 kg, SpO2 98 %. General: pleasant, WD, WN white male who is laying in bed in NAD HEENT: head is normocephalic, atraumatic.  Sclera are noninjected.  PERRL.  Ears and nose without any masses or lesions.  Mouth is pink and moist Heart: regular, rate, and rhythm.  Normal s1,s2. No obvious murmurs, gallops, or rubs noted.  Palpable radial and pedal pulses bilaterally Lungs: CTAB, no wheezes, rhonchi, or rales noted.  Respiratory effort nonlabored Abd: soft, NT, ND, +BS, no masses, hernias, or organomegaly MS: all 4 extremities are symmetrical with no cyanosis, clubbing, or edema. Skin: warm and dry with no masses, lesions, or rashes, but he does have an area of induration along the inferior aspect of his right gluteus tracking anteriorly towards his perineum.  This is erythematous and very tender but no obvious fluctuant abscess is identifiable.  There is a small half centimeter incision posterior to this from his attempted  incision and drainage in the ER.  No drainage. Neuro: Cranial nerves 2-12 grossly intact, sensation is normal throughout Psych: A&Ox3 with an appropriate affect.   Results for orders placed or performed during the hospital encounter of 06/28/19 (from the past 48 hour(s))  Basic metabolic panel     Status: Abnormal   Collection Time: 06/28/19 11:43 AM  Result Value Ref Range   Sodium 137 135 - 145 mmol/L   Potassium 4.1 3.5 - 5.1 mmol/L   Chloride 105 98 - 111 mmol/L   CO2 23 22 - 32 mmol/L   Glucose, Bld 99 70 - 99 mg/dL    Comment: Glucose reference range applies only to samples taken after fasting for at least 8 hours.   BUN  16 6 - 20 mg/dL   Creatinine, Ser 7.35 0.61 - 1.24 mg/dL   Calcium 8.6 (L) 8.9 - 10.3 mg/dL   GFR calc non Af Amer >60 >60 mL/min   GFR calc Af Amer >60 >60 mL/min   Anion gap 9 5 - 15    Comment: Performed at [facility], [address] [place], [place] [address]  CBC with Differential     Status: Abnormal   Collection Time: 06/28/19 11:43 AM  Result Value Ref Range   WBC 14.9 (H) 4.0 - 10.5 K/uL   RBC 5.37 4.22 - 5.81 MIL/uL   Hemoglobin 17.1 (H) 13.0 - 17.0 g/dL   HCT 42.6 83.4 - 19.6 %   MCV 94.6 80.0 - 100.0 fL   MCH 31.8 26.0 - 34.0 pg   MCHC 33.7 30.0 - 36.0 g/dL   RDW 22.2 97.9 - 89.2 %   Platelets 130 (L) 150 - 400 K/uL   nRBC 0.0 0.0 - 0.2 %   Neutrophils Relative % 80 %   Neutro Abs 12.0 (H) 1.7 - 7.7 K/uL   Lymphocytes Relative 8 %   Lymphs Abs 1.2 0.7 - 4.0 K/uL   Monocytes Relative 10 %   Monocytes Absolute 1.4 (H) 0.1 - 1.0 K/uL   Eosinophils Relative 1 %   Eosinophils Absolute 0.1 0.0 - 0.5 K/uL   Basophils Relative 0 %   Basophils Absolute 0.1 0.0 - 0.1 K/uL   Immature Granulocytes 1 %   Abs Immature Granulocytes 0.10 (H) 0.00 - 0.07 K/uL    Comment: Performed at [facility], [address], [place], [place] [address]   CT ABDOMEN PELVIS W CONTRAST  Result Date: 06/28/2019 CLINICAL DATA:  Anal or rectal abscess. Enlarging, per patient. EXAM: CT ABDOMEN AND PELVIS WITH CONTRAST TECHNIQUE: Multidetector CT imaging of the abdomen and pelvis was performed using the standard protocol following bolus administration of intravenous contrast. CONTRAST:  OMNIPAQUE IOHEXOL 300 MG/ML  SOLN COMPARISON:  CT abdomen dated 07/28/2010. FINDINGS: Lower chest: No acute findings. Calcified pleural plaques at each lung base suggesting previous cysts best a 6 posterior. Hepatobiliary: Liver is diffusely low in density suggesting fatty infiltration. Liver contours are slightly nodular raising the possibility of underlying cirrhosis. Status  post cholecystectomy. No bile duct dilatation. Pancreas: Unremarkable. No pancreatic ductal dilatation or surrounding inflammatory changes. Spleen: Mild splenomegaly. Adrenals/Urinary Tract: Adrenal glands appear normal. 5 mm nonobstructing RIGHT renal stone. No hydronephrosis bilaterally. No ureteral or bladder calculi identified. Bladder is unremarkable, partially decompressed. Stomach/Bowel: No dilated large or small bowel loops. No evidence of bowel wall inflammation. Surgical changes suggestive of appendectomy. Stomach is unremarkable, partially decompressed. Perirectal soft tissues are unremarkable. Vascular/Lymphatic: Mild aortic atherosclerosis. No  acute appearing vascular abnormality. No enlarged lymph nodes seen. Reproductive: Prostate gland is mildly prominent in size with central dystrophic calcifications. Other: Perianal abscess, likely multiloculated, centered to the RIGHT of the gluteal fold with extension to the midline posterior perineum, measuring 4.4 x 2.9 cm (image 111, series 2). Associated ill-defined edema within the subcutaneous soft tissues medial to the gluteus musculature. Musculoskeletal: no acute or suspicious osseous finding. Mild degenerative spurring within the lumbar spine. IMPRESSION: 1. Perianal abscess, likely multiloculated, centered to the RIGHT of the gluteal fold with extension to the midline posterior perineum, measuring 4.4 x 2.9 cm. Associated ill-defined edema within the subcutaneous soft tissues medial to the RIGHT gluteus musculature. The abscess is localized to the perianal soft tissues. Overlying perirectal fat is not involved. 2. Fatty infiltration of the liver. Liver contours are slightly nodular raising the possibility of underlying cirrhosis. 3. Mild splenomegaly. 4. 5 mm nonobstructing RIGHT renal stone. 5. 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 Aortic Atherosclerosis (ICD10-I70.0). Electronically Signed   By: Franki Cabot M.D.   On: 06/28/2019 16:07      Assessment/Plan Gout -resume home meds Elevated blood pressure -this may be secondary to pain as he does not have a history of hypertension.  We will have available as needed medications for blood pressure.  Perirectal abscess The patient appears to have a septated perirectal abscess.  He will be placed on IV antibiotic therapy and admitted to the hospital.  We are awaiting a Covid test currently, but when this has returned we will plan for surgical intervention with an incision and drainage and possible exam under anesthesia for definitive treatment of this abscess.  This has been discussed with the patient and he understands and is agreeable to proceed with this procedure.  He will be kept n.p.o. prior to his procedure.   FEN -n.p.o./IV fluids VTE -Lovenox to start tomorrow, SCDs ID -[email or web address] Admit -observation  Henreitta Cea, [facility] 06/28/2019, 4:28 PM Please see Amion for pager number during day hours 7:00am-4:30pm or 7:00am -11:30am on weekends

## 2019-06-28 NOTE — Anesthesia Procedure Notes (Signed)
Procedure Name: LMA Insertion Date/Time: 06/28/2019 6:03 PM Performed by: Basilio Cairo, CRNA Pre-anesthesia Checklist: Patient identified, Patient being monitored, Timeout performed, Emergency Drugs available and Suction available Patient Re-evaluated:Patient Re-evaluated prior to induction Oxygen Delivery Method: Circle system utilized Preoxygenation: Pre-oxygenation with 100% oxygen Induction Type: IV induction Ventilation: Mask ventilation without difficulty LMA: LMA inserted and LMA with gastric port inserted LMA Size: 5.0 Tube type: Oral Number of attempts: 1 Placement Confirmation: positive ETCO2 and breath sounds checked- equal and bilateral Tube secured with: Tape Dental Injury: Teeth and Oropharynx as per pre-operative assessment

## 2019-06-28 NOTE — Transfer of Care (Signed)
Immediate Anesthesia Transfer of Care Note  Patient: Joshua [name]  Procedure(s) Performed: EXAM UNDER ANESTHESIA (N/A ) IRRIGATION AND DEBRIDEMENT PERIRECTAL ABSCESS (N/A )  Patient Location: PACU  Anesthesia Type:General  Level of Consciousness: awake, alert , oriented and patient cooperative  Airway & Oxygen Therapy: Patient Spontanous Breathing and Patient connected to face mask oxygen  Post-op Assessment: Report given to RN and Post -op Vital signs reviewed and stable  Post vital signs: Reviewed and stable  Last Vitals:  Vitals Value Taken Time  BP 177/104 06/28/19 1851  Temp    Pulse 101 06/28/19 1853  Resp 14 06/28/19 1853  SpO2 100 % 06/28/19 1853  Vitals shown include unvalidated device data.  Last Pain:  Vitals:   06/28/19 1743  TempSrc:   PainSc: 0-No pain         Complications: No apparent anesthesia complications

## 2019-06-28 NOTE — Anesthesia Postprocedure Evaluation (Signed)
Anesthesia Post Note  Patient: Joshua [name]  Procedure(s) Performed: EXAM UNDER ANESTHESIA (N/A ) IRRIGATION AND DEBRIDEMENT PERIRECTAL ABSCESS (N/A )     Patient location during evaluation: PACU Anesthesia Type: General Level of consciousness: awake and alert Pain management: pain level controlled Vital Signs Assessment: post-procedure vital signs reviewed and stable Respiratory status: spontaneous breathing, nonlabored ventilation, respiratory function stable and patient connected to nasal cannula oxygen Cardiovascular status: blood pressure returned to baseline and stable Postop Assessment: no apparent nausea or vomiting Anesthetic complications: no    Last Vitals:  Vitals:   06/28/19 1930 06/28/19 1951  BP: (!) 153/88 (!) 151/96  Pulse: 92 91  Resp: 20   Temp: 36.9 C   SpO2: 99% 98%    Last Pain:  Vitals:   06/28/19 1900  TempSrc:   PainSc: 2                  Dorothee Napierkowski S

## 2019-06-28 NOTE — Op Note (Signed)
06/28/2019  7:04 PM  PATIENT:  Joshua [name]  54 y.o. male  Patient Care Team: Patient, No Pcp Per as PCP - General (General Practice)  PRE-OPERATIVE DIAGNOSIS:  Perirectal abscess  POST-OPERATIVE DIAGNOSIS:  Perirectal abscess  PROCEDURE:  INCISION AND DEBRIDEMENT PERIRECTAL ABSCESS EXAM UNDER ANESTHESIA SURGEON:  Adin Hector, MD   ASSISTANT: OR Staff   ANESTHESIA:   local and general  EBL:  No intake/output data recorded.  Delay start of Pharmacological VTE agent (>24hrs) due to surgical blood loss or risk of bleeding:  no  DRAINS: none   SPECIMEN:  No Specimen  DISPOSITION OF SPECIMEN:  N/A  COUNTS:  YES  PLAN OF CARE: Admit for overnight observation  PATIENT DISPOSITION:  PACU - hemodynamically stable.  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  Became unbearable.  Came to emergency room.  Peritoneal abscess suspected but seemed rather deep.  Not amenable to bedside drainage.  Surgical consultation requested.  Recommended urgent examination under anesthesia with incision and drainage  The anatomy and physiology of skin abscesses was discussed. Pathophysiology of SQ abscess, possible progression to fasciitis & sepsis, etc discussed . I stressed good hygiene & wound care. Possible redebridement was discussed as well.   Possibility of recurrence was discussed. Risks, benefits, alternatives were discussed. I noted a good likelihood this will help address the problem. Risks of anesthesia and other risks discussed. Questions answered. The patient is does wish to proceed.   OR FINDINGS: Right anterior perirectal abscess with anterior horseshoe pattern carrying over anteriorly to left anterior aspect.  Some continuation cephalad into the upper medial ischiorectal space as well as right posterior perirectal region.  Incisional wound 4 x 3 cm.  Base of cavity goes 8 x 6 cm.  It is 7 cm deep/cephalad   DESCRIPTION:   Informed consent was  confirmed. The patient received IV antibiotics. The patient underwent general anesthesia without any difficulty. The patient was positioned in high lithotomy. SCDs were active during the entire case. The area around the abscess was prepped and draped in a sterile fashion. A surgical timeout confirmed our plan.   I did anorectal examination.  Findings noted.  I did an anorectal block using quarter percent bupivacaine with epinephrine mixed equally with liposomal bupivacaine/Exparel.  I then used an 18-gauge needle through the most fluctuant area of the right anterior perirectal mass and encountered pus and deeply at the hub of the needle.  I made a 3 cm radial incision in the right anterior aspect.  Encountered some deeper pus.  I probed more superiorly and up into the perineum and encountered the largest pus pocket.  I could sweep my finger up into the upper medial issue rectal space as well as into the right posterior ischio rectal space.  Broke up loculations with a good cavity.    I created some stellate incisions and enlarged the I&D wound to be 4 x 3 cm.  It was somewhat oozy and inflamed.  I packed the wound with half-inch ribbon Nu Gauze.  Used a whole bottle to help pack the region well for good hemostasis.  Sterile dressings applied.  Patient is being extubated go to recovery room.   We plan to continue IV antibiotics and remove packing tomorrow.   Hopefully leave in the morning if improved.  Otherwise may need to stay for pain control.  I discussed operative findings, updated the patient's status, discussed probable steps to recovery, and gave postoperative recommendations to the patient's  spouse, [name].  Recommendations were made.  Questions were answered.  She expressed understanding & appreciation.     Ardeth Sportsman, M.D., F.A.C.S. Gastrointestinal and Minimally Invasive Surgery [facility] [address] [place], [place] [address] [phone]  Main / Paging

## 2019-06-28 NOTE — Progress Notes (Addendum)
Pharmacy Antibiotic Note  Joshua [name] is a 54 y.o. male admitted on 06/28/2019 with perirectal abscess.  Pharmacy has been consulted for gentamicin dosing x 5 days. He was given gent 100 mg pre-op at 1822.   Plan: Gentamicin 7 mg/kg ABW = 640 mg IV q24 x 5 doses Clinda 900 mg IV q6h adjusted to 900 mg IV q8hr Get 10 hr gent level after 2nd dose F/u renal fxn, WBC, temp  Height: 5\' 10"  (177.8 cm) Weight: 260 lb (117.9 kg) IBW/kg (Calculated) : 73  Temp (24hrs), Avg:98.8 F (37.1 C), Min:98.1 F (36.7 C), Max:99.4 F (37.4 C)  Recent Labs  Lab 06/28/19 1143  WBC 14.9*  CREATININE 1.06    Estimated Creatinine Clearance: 102.5 mL/min (by C-G formula based on SCr of 1.06 mg/dL).    Allergies  Allergen Reactions  . Penicillins     Antimicrobials this admission: 3/11 clinda>> 3/11 gent>> Dose adjustments this admission:  Microbiology results: 3/11 Covid/flu neg 3/11 HIV ip  Thank you for allowing pharmacy to be a part of this patient's care.  5/11, Pharm.D [phone] 06/28/2019 7:17 PM

## 2019-06-28 NOTE — Interval H&P Note (Signed)
History and Physical Interval Note:  06/28/2019 5:33 PM  Joshua [name]  has presented today for surgery, with the diagnosis of Perirectal abscess.  The various methods of treatment have been discussed with the patient and family. After consideration of risks, benefits and other options for treatment, the patient has consented to  Procedure(s): EXAM UNDER ANESTHESIA (N/A) IRRIGATION AND DEBRIDEMENT PERIRECTAL ABSCESS (N/A) as a surgical intervention.  The patient's history has been reviewed, patient examined, no change in status, stable for surgery.  I have reviewed the patient's chart and labs.  Questions were answered to the patient's satisfaction.    I have re-reviewed the the patient's records, history, medications, and allergies.  I have re-examined the patient.  I again discussed intraoperative plans and goals of post-operative recovery.  The patient agrees to proceed.  Joshua [name]  1965/12/14 [id number]  Patient Care Team: Patient, No Pcp Per as PCP - General (General Practice)  Patient Active Problem List   Diagnosis Date Noted   Perirectal abscess 06/28/2019    Past Medical History:  Diagnosis Date   Gout    Gout    Low testosterone     Past Surgical History:  Procedure Laterality Date   APPENDECTOMY     CHOLECYSTECTOMY      Social History   Socioeconomic History   Marital status: Married    Spouse name: Not on file   Number of children: Not on file   Years of education: Not on file   Highest education level: Not on file  Occupational History   Not on file  Tobacco Use   Smoking status: Unknown If Ever Smoked   Smokeless tobacco: Current User    Types: Snuff  Substance and Sexual Activity   Alcohol use: Yes    Comment: several shots 06/27/2019 in am    Drug use: Never   Sexual activity: Not on file  Other Topics Concern   Not on file  Social History Narrative   Not on file   Social Determinants of Health   Financial Resource Strain:    Difficulty of  Paying Living Expenses:   Food Insecurity:    Worried About Programme researcher, broadcasting/film/video in the Last Year:    Barista in the Last Year:   Transportation Needs:    Freight forwarder (Medical):    Lack of Transportation (Non-Medical):   Physical Activity:    Days of Exercise per Week:    Minutes of Exercise per Session:   Stress:    Feeling of Stress :   Social Connections:    Frequency of Communication with Friends and Family:    Frequency of Social Gatherings with Friends and Family:    Attends Religious Services:    Active Member of Clubs or Organizations:    Attends Engineer, structural:    Marital Status:   Intimate Partner Violence:    Fear of Current or Ex-Partner:    Emotionally Abused:    Physically Abused:    Sexually Abused:     Family History  Problem Relation Age of Onset   Heart failure Mother    Diabetes Mother    Cancer Father    Diabetes Father     Medications Prior to Admission  Medication Sig Dispense Refill Last Dose   allopurinol (ZYLOPRIM) 100 MG tablet Take 100 mg by mouth daily.   06/27/2019 at Unknown time   amoxicillin-clavulanate (AUGMENTIN) 500-125 MG tablet Take 1 tablet by mouth every 8 (eight)  hours. 10 day supply   06/28/2019 at Unknown time   ibuprofen (ADVIL) 200 MG tablet Take 800 mg by mouth every 6 (six) hours as needed for headache or moderate pain.   06/27/2019 at Unknown time   Menthol-Zinc Oxide (GOLD BOND EX) Apply 1 application topically as needed (irritation).   06/27/2019 at Unknown time   naproxen sodium (ALEVE) 220 MG tablet Take 440 mg by mouth 2 (two) times daily as needed (pain).   06/28/2019 at Unknown time   testosterone cypionate (DEPOTESTOSTERONE CYPIONATE) 200 MG/ML injection Inject 200 mg into the muscle every 14 (fourteen) days.   06/16/2019   traZODone (DESYREL) 150 MG tablet Take 150 mg by mouth daily.   06/27/2019 at Unknown time   clindamycin (CLEOCIN) 150 MG capsule Take 1 capsule (150 mg total) by mouth 4  (four) times daily. (Patient not taking: Reported on 06/28/2019) 30 capsule 0 Not Taking at Unknown time    Current Facility-Administered Medications  Medication Dose Route Frequency Provider Last Rate Last Admin   [MAR Hold] acetaminophen (TYLENOL) tablet 1,000 mg  1,000 mg Oral Q6H Barnetta Chapel, PA-C       [START ON 06/29/2019] acetaminophen (TYLENOL) tablet 1,000 mg  1,000 mg Oral On Call to OR Barnetta Chapel, PA-C       [facility] Hold] allopurinol (ZYLOPRIM) tablet 100 mg  100 mg Oral Daily Barnetta Chapel, PA-C       [facility] Hold] clindamycin (CLEOCIN) IVPB 600 mg  600 mg Intravenous Q8H Barnetta Chapel, PA-C       dextrose 5 %-0.45 % sodium chloride infusion   Intravenous Continuous Barnetta Chapel, PA-C       [facility] Hold] diphenhydrAMINE (BENADRYL) capsule 25 mg  25 mg Oral Q6H PRN Barnetta Chapel, PA-C       Or   [MAR Hold] diphenhydrAMINE (BENADRYL) injection 25 mg  25 mg Intravenous Q6H PRN Barnetta Chapel, PA-C       [facility] Hold] docusate sodium (COLACE) capsule 100 mg  100 mg Oral BID Barnetta Chapel, PA-C       [facility] Hold] enoxaparin (LOVENOX) injection 40 mg  40 mg Subcutaneous Q24H Barnetta Chapel, PA-C       [START ON 06/29/2019] gabapentin (NEURONTIN) capsule 300 mg  300 mg Oral On Call to OR Barnetta Chapel, PA-C       [facility] Hold] ibuprofen (ADVIL) tablet 800 mg  800 mg Oral Q6H PRN Barnetta Chapel, PA-C       lactated ringers infusion   Intravenous Continuous Eilene Ghazi, MD       [facility] Hold] metoprolol tartrate (LOPRESSOR) injection 5 mg  5 mg Intravenous Q6H PRN Barnetta Chapel, PA-C       [facility] Hold] ondansetron (ZOFRAN-ODT) disintegrating tablet 4 mg  4 mg Oral Q6H PRN Barnetta Chapel, PA-C       Or   [MAR Hold] ondansetron [facility]) injection 4 mg  4 mg Intravenous Q6H PRN Barnetta Chapel, PA-C       [facility] Hold] polyethylene glycol (MIRALAX / GLYCOLAX) packet 17 g  17 g Oral Daily Barnetta Chapel, PA-C       sodium chloride (PF) 0.9 % injection            sodium chloride (PF) 0.9 % injection             [MAR Hold] traZODone (DESYREL) tablet 150 mg  150 mg Oral Daily Barnetta Chapel, PA-C         Allergies  Allergen Reactions   Penicillins  BP (!) 153/90 (BP Location: Right Arm)   Pulse 78   Temp 98.1 F (36.7 C) (Oral)   Resp 16   Ht 5\' 10"  (1.778 m)   Wt 117.9 kg   SpO2 100%   BMI 37.31 kg/m   Labs: Results for orders placed or performed during the hospital encounter of 06/28/19 (from the past 48 hour(s))  Basic metabolic panel     Status: Abnormal   Collection Time: 06/28/19 11:43 AM  Result Value Ref Range   Sodium 137 135 - 145 mmol/L   Potassium 4.1 3.5 - 5.1 mmol/L   Chloride 105 98 - 111 mmol/L   CO2 23 22 - 32 mmol/L   Glucose, Bld 99 70 - 99 mg/dL    Comment: Glucose reference range applies only to samples taken after fasting for at least 8 hours.   BUN 16 6 - 20 mg/dL   Creatinine, Ser 1.611.06 0.61 - 1.24 mg/dL   Calcium 8.6 (L) 8.9 - 10.3 mg/dL   GFR calc non Af Amer >60 >60 mL/min   GFR calc Af Amer >60 >60 mL/min   Anion gap 9 5 - 15    Comment: Performed at [facility], [address] [place], [place] [address]  CBC with Differential     Status: Abnormal   Collection Time: 06/28/19 11:43 AM  Result Value Ref Range   WBC 14.9 (H) 4.0 - 10.5 K/uL   RBC 5.37 4.22 - 5.81 MIL/uL   Hemoglobin 17.1 (H) 13.0 - 17.0 g/dL   HCT 54.050.8 98.139.0 - 19.152.0 %   MCV 94.6 80.0 - 100.0 fL   MCH 31.8 26.0 - 34.0 pg   MCHC 33.7 30.0 - 36.0 g/dL   RDW 47.813.2 29.511.5 - 62.115.5 %   Platelets 130 (L) 150 - 400 K/uL   nRBC 0.0 0.0 - 0.2 %   Neutrophils Relative % 80 %   Neutro Abs 12.0 (H) 1.7 - 7.7 K/uL   Lymphocytes Relative 8 %   Lymphs Abs 1.2 0.7 - 4.0 K/uL   Monocytes Relative 10 %   Monocytes Absolute 1.4 (H) 0.1 - 1.0 K/uL   Eosinophils Relative 1 %   Eosinophils Absolute 0.1 0.0 - 0.5 K/uL   Basophils Relative 0 %   Basophils Absolute 0.1 0.0 - 0.1 K/uL   Immature Granulocytes 1 %   Abs Immature Granulocytes 0.10 (H) 0.00 - 0.07 K/uL    Comment:  Performed at [facility], [address] [place], [place] [address]  Respiratory Panel by RT PCR (Flu A&B, Covid) - Nasopharyngeal Swab     Status: None   Collection Time: 06/28/19  3:46 PM   Specimen: Nasopharyngeal Swab  Result Value Ref Range   SARS Coronavirus 2 by RT PCR NEGATIVE NEGATIVE    Comment: (NOTE) SARS-CoV-2 target nucleic acids are NOT DETECTED. The SARS-CoV-2 RNA is generally detectable in upper respiratoy specimens during the acute phase of infection. The lowest concentration of SARS-CoV-2 viral copies this assay can detect is 131 copies/mL. A negative result does not preclude SARS-Cov-2 infection and should not be used as the sole basis for treatment or other patient management decisions. A negative result may occur with  improper specimen collection/handling, submission of specimen other than nasopharyngeal swab, presence of viral mutation(s) within the areas targeted by this assay, and inadequate number of viral copies (<131 copies/mL). A negative result must be combined with clinical observations, patient history, and epidemiological information. The expected result is  Negative. Fact Sheet for Patients:  https://www.moore.com/ Fact Sheet for Healthcare Providers:  https://www.young.biz/ This test is not yet ap proved or cleared by the [place] FDA and  has been authorized for detection and/or diagnosis of SARS-CoV-2 by FDA under an Emergency Use Authorization (EUA). This EUA will remain  in effect (meaning this test can be used) for the duration of the COVID-19 declaration under Section 564(b)(1) of the Act, 21 U.S.C. section 360bbb-3(b)(1), unless the authorization is terminated or revoked sooner.    Influenza A by PCR NEGATIVE NEGATIVE   Influenza B by PCR NEGATIVE NEGATIVE    Comment: (NOTE) The Xpert Xpress SARS-CoV-2/FLU/RSV assay is intended as an aid in  the diagnosis of influenza from  Nasopharyngeal swab specimens and  should not be used as a sole basis for treatment. Nasal washings and  aspirates are unacceptable for Xpert Xpress SARS-CoV-2/FLU/RSV  testing. Fact Sheet for Patients: https://www.moore.com/ Fact Sheet for Healthcare Providers: https://www.young.biz/ This test is not yet approved or cleared by the [place] FDA and  has been authorized for detection and/or diagnosis of SARS-CoV-2 by  FDA under an Emergency Use Authorization (EUA). This EUA will remain  in effect (meaning this test can be used) for the duration of the  Covid-19 declaration under Section 564(b)(1) of the Act, 21  U.S.C. section 360bbb-3(b)(1), unless the authorization is  terminated or revoked. Performed at [facility], [address], [place], [place] [address]     Imaging / Studies: CT ABDOMEN PELVIS W CONTRAST  Result Date: 06/28/2019 CLINICAL DATA:  Anal or rectal abscess. Enlarging, per patient. EXAM: CT ABDOMEN AND PELVIS WITH CONTRAST TECHNIQUE: Multidetector CT imaging of the abdomen and pelvis was performed using the standard protocol following bolus administration of intravenous contrast. CONTRAST:  OMNIPAQUE IOHEXOL 300 MG/ML  SOLN COMPARISON:  CT abdomen dated 07/28/2010. FINDINGS: Lower chest: No acute findings. Calcified pleural plaques at each lung base suggesting previous cysts best a 6 posterior. Hepatobiliary: Liver is diffusely low in density suggesting fatty infiltration. Liver contours are slightly nodular raising the possibility of underlying cirrhosis. Status post cholecystectomy. No bile duct dilatation. Pancreas: Unremarkable. No pancreatic ductal dilatation or surrounding inflammatory changes. Spleen: Mild splenomegaly. Adrenals/Urinary Tract: Adrenal glands appear normal. 5 mm nonobstructing RIGHT renal stone. No hydronephrosis bilaterally. No ureteral or bladder calculi identified. Bladder is  unremarkable, partially decompressed. Stomach/Bowel: No dilated large or small bowel loops. No evidence of bowel wall inflammation. Surgical changes suggestive of appendectomy. Stomach is unremarkable, partially decompressed. Perirectal soft tissues are unremarkable. Vascular/Lymphatic: Mild aortic atherosclerosis. No acute appearing vascular abnormality. No enlarged lymph nodes seen. Reproductive: Prostate gland is mildly prominent in size with central dystrophic calcifications. Other: Perianal abscess, likely multiloculated, centered to the RIGHT of the gluteal fold with extension to the midline posterior perineum, measuring 4.4 x 2.9 cm (image 111, series 2). Associated ill-defined edema within the subcutaneous soft tissues medial to the gluteus musculature. Musculoskeletal: no acute or suspicious osseous finding. Mild degenerative spurring within the lumbar spine. IMPRESSION: 1. Perianal abscess, likely multiloculated, centered to the RIGHT of the gluteal fold with extension to the midline posterior perineum, measuring 4.4 x 2.9 cm. Associated ill-defined edema within the subcutaneous soft tissues medial to the RIGHT gluteus musculature. The abscess is localized to the perianal soft tissues. Overlying perirectal fat is not involved. 2. Fatty infiltration of the liver. Liver contours are slightly nodular raising the possibility of underlying cirrhosis. 3. Mild splenomegaly. 4. 5 mm nonobstructing RIGHT renal stone.  5. 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 Aortic Atherosclerosis (ICD10-I70.0). Electronically Signed   By: Franki Cabot M.D.   On: 06/28/2019 16:07     .Adin Hector, M.D., F.A.C.S. Gastrointestinal and Minimally Invasive Surgery [facility] [address], [place], [place] [address] [phone] Main / Paging  06/28/2019 5:33 PM    Adin Hector

## 2019-06-28 NOTE — ED Triage Notes (Signed)
Patient states he went to his PCP yesterday for a rectal abscess. Patient states he was prescribed antibiotics. Patient states the abscess has tripled in size since yesterday.

## 2019-06-28 NOTE — ED Provider Notes (Signed)
[facility] DEPT Provider Note   CSN: [id number] Arrival date & time: 06/28/19  1044     History Chief Complaint  Patient presents with  . Rectal Pain    Joshua [name] is a 54 y.o. male with past medical history significant for gout, low testosterone presents to emergency room today with chief complaint of rectal pain x 4 days.  Patient states he noticed the pain after a day of yard work when he was lifting and rolling out approximately 100 rolls of sod.  He states later that night he had pain near his rectum. He looked at the area and noticed it was red. He describes the pain as a sharp and aching.  The pain radiates to his rectum.  He works as a Research scientist (physical sciences) and states he had a trip to [place] x2 days ago.  He states after the trip the pain worsened so he went to his PCP.  He was prescribed clindamycin for possible rectal abscess. He has only taken 3 doses so far and despite antibiotics thinks the abscess has gotten bigger.Marland Kitchen  He is also endorsing pain with defecation as well as chills. He denies fever, abdominal pain, nausea, vomiting, dysuria, urinary frequency, gross hematuria.  Past Medical History:  Diagnosis Date  . Gout   . Gout   . Low testosterone     There are no problems to display for this patient.   Past Surgical History:  Procedure Laterality Date  . APPENDECTOMY    . CHOLECYSTECTOMY         Family History  Problem Relation Age of Onset  . Heart failure Mother   . Diabetes Mother   . Cancer Father   . Diabetes Father     Social History   Tobacco Use  . Smoking status: Current Every Day Smoker    Types: Cigarettes  . Smokeless tobacco: Current User    Types: Snuff  Substance Use Topics  . Alcohol use: Yes  . Drug use: Never    Home Medications Prior to Admission medications   Medication Sig Start Date End Date Taking? Authorizing Provider  allopurinol (ZYLOPRIM) 100 MG tablet Take 100 mg by mouth daily.    Yes [provider]  amoxicillin-clavulanate (AUGMENTIN) 500-125 MG tablet Take 1 tablet by mouth every 8 (eight) hours. 10 day supply 06/27/19  Yes [provider]  ibuprofen (ADVIL) 200 MG tablet Take 800 mg by mouth every 6 (six) hours as needed for headache or moderate pain.   Yes [provider]  Menthol-Zinc Oxide (GOLD BOND EX) Apply 1 application topically as needed (irritation).   Yes [provider]  naproxen sodium (ALEVE) 220 MG tablet Take 440 mg by mouth 2 (two) times daily as needed (pain).   Yes [provider]  testosterone cypionate (DEPOTESTOSTERONE CYPIONATE) 200 MG/ML injection Inject 200 mg into the muscle every 14 (fourteen) days. 05/21/19  Yes [provider]  traZODone (DESYREL) 150 MG tablet Take 150 mg by mouth daily. 03/23/19  Yes [provider]  clindamycin (CLEOCIN) 150 MG capsule Take 1 capsule (150 mg total) by mouth 4 (four) times daily. Patient not taking: Reported on 06/28/2019 07/08/14   Harriet Masson, DPM    Allergies    Penicillins  Review of Systems   Review of Systems  All other systems are reviewed and are negative for acute change except as noted in the HPI.   Physical Exam Updated Vital Signs BP (!) 178/97 (BP  Location: Right Arm)   Pulse 80   Temp 98.1 F (36.7 C) (Oral)   Resp 14   Ht 5\' 10"  (1.778 m)   Wt 117.9 kg   SpO2 97%   BMI 37.31 kg/m   Physical Exam Vitals and nursing note reviewed.  Constitutional:      General: He is not in acute distress.    Appearance: He is not ill-appearing.  HENT:     Head: Normocephalic and atraumatic.     Right Ear: Tympanic membrane and external ear normal.     Left Ear: Tympanic membrane and external ear normal.     Nose: Nose normal.     Mouth/Throat:     Mouth: Mucous membranes are moist.     Pharynx: Oropharynx is clear.  Eyes:     General: No scleral icterus.       Right eye: No discharge.        Left eye: No discharge.      Extraocular Movements: Extraocular movements intact.     Conjunctiva/sclera: Conjunctivae normal.     Pupils: Pupils are equal, round, and reactive to light.  Neck:     Vascular: No JVD.  Cardiovascular:     Rate and Rhythm: Normal rate and regular rhythm.     Pulses: Normal pulses.          Radial pulses are 2+ on the right side and 2+ on the left side.     Heart sounds: Normal heart sounds.  Pulmonary:     Comments: Lungs clear to auscultation in all fields. Symmetric chest rise. No wheezing, rales, or rhonchi. Abdominal:     Comments: Abdomen is soft, non-distended, and non-tender in all quadrants. No rigidity, no guarding. No peritoneal signs.  Genitourinary:      Comments: Chaperone EMTpresent for exam. Digital Rectal Exam reveals sphincter with good tone. No external hemorrhoids. No masses or fissures. Stool color is brown with no overt blood. No gross melena.   Musculoskeletal:        General: Normal range of motion.     Cervical back: Normal range of motion.  Skin:    General: Skin is warm and dry.     Capillary Refill: Capillary refill takes less than 2 seconds.  Neurological:     Mental Status: He is oriented to person, place, and time.     GCS: GCS eye subscore is 4. GCS verbal subscore is 5. GCS motor subscore is 6.     Comments: Fluent speech, no facial droop.  Psychiatric:        Behavior: Behavior normal.       ED Results / Procedures / Treatments   Labs (all labs ordered are listed, but only abnormal results are displayed) Labs Reviewed  BASIC METABOLIC PANEL - Abnormal; Notable for the following components:      Result Value   Calcium 8.6 (*)    All other components within normal limits  CBC WITH DIFFERENTIAL/PLATELET - Abnormal; Notable for the following components:   WBC 14.9 (*)    Hemoglobin 17.1 (*)    Platelets 130 (*)    Neutro Abs 12.0 (*)    Monocytes Absolute 1.4 (*)    Abs Immature Granulocytes 0.10 (*)    All other  components within normal limits  RESPIRATORY PANEL BY RT PCR (FLU A&B, COVID)    EKG None  Radiology CT ABDOMEN PELVIS W CONTRAST  Result Date: 06/28/2019 CLINICAL DATA:  Anal or rectal abscess.  Enlarging, per patient. EXAM: CT ABDOMEN AND PELVIS WITH CONTRAST TECHNIQUE: Multidetector CT imaging of the abdomen and pelvis was performed using the standard protocol following bolus administration of intravenous contrast. CONTRAST:  OMNIPAQUE IOHEXOL 300 MG/ML  SOLN COMPARISON:  CT abdomen dated 07/28/2010. FINDINGS: Lower chest: No acute findings. Calcified pleural plaques at each lung base suggesting previous cysts best a 6 posterior. Hepatobiliary: Liver is diffusely low in density suggesting fatty infiltration. Liver contours are slightly nodular raising the possibility of underlying cirrhosis. Status post cholecystectomy. No bile duct dilatation. Pancreas: Unremarkable. No pancreatic ductal dilatation or surrounding inflammatory changes. Spleen: Mild splenomegaly. Adrenals/Urinary Tract: Adrenal glands appear normal. 5 mm nonobstructing RIGHT renal stone. No hydronephrosis bilaterally. No ureteral or bladder calculi identified. Bladder is unremarkable, partially decompressed. Stomach/Bowel: No dilated large or small bowel loops. No evidence of bowel wall inflammation. Surgical changes suggestive of appendectomy. Stomach is unremarkable, partially decompressed. Perirectal soft tissues are unremarkable. Vascular/Lymphatic: Mild aortic atherosclerosis. No acute appearing vascular abnormality. No enlarged lymph nodes seen. Reproductive: Prostate gland is mildly prominent in size with central dystrophic calcifications. Other: Perianal abscess, likely multiloculated, centered to the RIGHT of the gluteal fold with extension to the midline posterior perineum, measuring 4.4 x 2.9 cm (image 111, series 2). Associated ill-defined edema within the subcutaneous soft tissues medial to the gluteus musculature.  Musculoskeletal: no acute or suspicious osseous finding. Mild degenerative spurring within the lumbar spine. IMPRESSION: 1. Perianal abscess, likely multiloculated, centered to the RIGHT of the gluteal fold with extension to the midline posterior perineum, measuring 4.4 x 2.9 cm. Associated ill-defined edema within the subcutaneous soft tissues medial to the RIGHT gluteus musculature. The abscess is localized to the perianal soft tissues. Overlying perirectal fat is not involved. 2. Fatty infiltration of the liver. Liver contours are slightly nodular raising the possibility of underlying cirrhosis. 3. Mild splenomegaly. 4. 5 mm nonobstructing RIGHT renal stone. 5. 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 Aortic Atherosclerosis (ICD10-I70.0). Electronically Signed   By: Bary Richard M.D.   On: 06/28/2019 16:07    Procedures .Marland KitchenIncision and Drainage  Date/Time: 06/28/2019 3:08 PM Performed by: Sherene Sires, PA-C Authorized by: Sherene Sires, PA-C   Consent:    Consent obtained:  Verbal   Consent given by:  Patient   Risks discussed:  Bleeding and infection   Alternatives discussed:  No treatment Location:    Type:  Abscess   Size:  7x4   Location:  Anogenital   Anogenital location:  Perirectal Pre-procedure details:    Skin preparation:  Hibiclens Anesthesia (see MAR for exact dosages):    Anesthesia method:  Local infiltration   Local anesthetic:  Lidocaine 1% w/o epi Procedure details:    Incision types:  Single straight   Incision depth:  Subcutaneous   Scalpel blade:  11   Wound management:  Probed and deloculated   Drainage:  Bloody   Drainage amount:  Scant   Wound treatment:  Wound left open   Packing materials:  None Post-procedure details:    Patient tolerance of procedure:  Tolerated well, no immediate complications   (including critical care time)  Medications Ordered in ED Medications  sodium chloride (PF) 0.9 %  injection (  Canceled Entry 06/28/19 1520)  sodium chloride (PF) 0.9 % injection (  Canceled Entry 06/28/19 1521)  morphine 4 MG/ML injection 4 mg (4 mg Intravenous Given 06/28/19 1212)  ondansetron (ZOFRAN) injection 4 mg (4 mg Intravenous Given 06/28/19  1212)  lidocaine (PF) (XYLOCAINE) 1 % injection 30 mL (30 mLs Infiltration Given 06/28/19 1346)  iohexol (OMNIPAQUE) 300 MG/ML solution 100 mL (100 mLs Intravenous Contrast Given 06/28/19 1437)  clindamycin (CLEOCIN) IVPB 600 mg (600 mg Intravenous New Bag/Given 06/28/19 1550)    ED Course  I have reviewed the triage vital signs and the nursing notes.  Pertinent labs & imaging results that were available during my care of the patient were reviewed by me and considered in my medical decision making (see chart for details).    MDM Rules/Calculators/A&P                      Patient seen and examined. Patient presents awake, alert, hemodynamically stable, afebrile, non toxic. On exam he has what appears to be 7x4 cm perirectal abscess.  Abscess does not obviously extend into the rectum.  Patient tolerated DRE without pain.  No gross melena. Labs show leukocytosis of 14.9 with left shift, no anemia.  BMP overall unremarkable.  Pain and nausea medicine given.  Attempted bedside I&D, please see procedure note above.  There was minimal bloody drainage, no purulent discharge.  Patient tolerated well. The patient was discussed with and seen by Dr. Particia Nearing who agrees with the treatment plan.  CT abdomen pelvis viewed my be shows perianal abscess extending to midline posterior perineum. Will give IV clindamycin. Case discussed with on-call general surgeon K. Earl Gala PA-C who recommends admission with possible OR tonight. Covid PCR ordered.  Ct also with incidental finding of calcified pleural plaques at each lung base suggesting previous asbestos exposure.  I discussed incidental findings with patient.  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  Recommend patient follow-up PCP for further work-up of findings today.  No need for emergent intervention at this time.  Patient agreeable with plan of care.  Portions of this note were generated with Scientist, clinical (histocompatibility and immunogenetics). Dictation errors may occur despite best attempts at proofreading.   Final Clinical Impression(s) / ED Diagnoses Final diagnoses:  Perianal abscess    Rx / DC Orders ED Discharge Orders    None       Sherene Sires, PA-C 06/28/19 1626    Jacalyn Lefevre, MD 06/30/19 [phone]

## 2019-06-29 LAB — CBC
HCT: 51.9 % (ref 39.0–52.0)
Hemoglobin: 16.9 g/dL (ref 13.0–17.0)
MCH: 31.6 pg (ref 26.0–34.0)
MCHC: 32.6 g/dL (ref 30.0–36.0)
MCV: 97.2 fL (ref 80.0–100.0)
Platelets: 142 10*3/uL — ABNORMAL LOW (ref 150–400)
RBC: 5.34 MIL/uL (ref 4.22–5.81)
RDW: 13.2 % (ref 11.5–15.5)
WBC: 15 10*3/uL — ABNORMAL HIGH (ref 4.0–10.5)
nRBC: 0 % (ref 0.0–0.2)

## 2019-06-29 LAB — BASIC METABOLIC PANEL
Anion gap: 8 (ref 5–15)
BUN: 15 mg/dL (ref 6–20)
CO2: 27 mmol/L (ref 22–32)
Calcium: 9.2 mg/dL (ref 8.9–10.3)
Chloride: 103 mmol/L (ref 98–111)
Creatinine, Ser: 1.16 mg/dL (ref 0.61–1.24)
GFR calc Af Amer: >60 mL/min (ref >60)
GFR calc non Af Amer: >60 mL/min (ref >60)
Glucose, Bld: 146 mg/dL — ABNORMAL HIGH (ref 70–99)
Potassium: 4.8 mmol/L (ref 3.5–5.1)
Sodium: 138 mmol/L (ref 135–145)

## 2019-06-29 MED ORDER — POLYETHYLENE GLYCOL 3350 17 G PO PACK: 17.0000 g | PACK | Freq: Every day | ORAL | 0 refills | Status: DC

## 2019-06-29 MED ORDER — OXYCODONE HCL 5 MG PO TABS: 5.0000 mg | ORAL_TABLET | Freq: Four times a day (QID) | ORAL | 0 refills | Status: DC | PRN

## 2019-06-29 MED ORDER — CIPROFLOXACIN HCL 500 MG PO TABS: 500.0000 mg | ORAL_TABLET | Freq: Two times a day (BID) | ORAL | 0 refills | Status: AC

## 2019-06-29 MED ORDER — ACETAMINOPHEN 500 MG PO TABS: 1000.0000 mg | ORAL_TABLET | Freq: Four times a day (QID) | ORAL | 0 refills | Status: DC

## 2019-06-29 MED ORDER — METRONIDAZOLE 500 MG PO TABS: 500.0000 mg | ORAL_TABLET | Freq: Three times a day (TID) | ORAL | 0 refills | Status: AC

## 2019-06-29 NOTE — Plan of Care (Signed)

## 2019-06-29 NOTE — Plan of Care (Signed)
Problem: Education: Goal: Knowledge of General Education information will improve Description: Including pain rating scale, medication(s)/side effects and non-pharmacologic comfort measures 06/29/2019 1049 by Iantha Fallen, RN Outcome: Adequate for Discharge 06/29/2019 1049 by Iantha Fallen, RN Outcome: Adequate for Discharge 06/29/2019 1047 by Iantha Fallen, RN Outcome: Progressing   Problem: Health Behavior/Discharge Planning: Goal: Ability to manage health-related needs will improve 06/29/2019 1049 by Iantha Fallen, RN Outcome: Adequate for Discharge 06/29/2019 1049 by Iantha Fallen, RN Outcome: Adequate for Discharge 06/29/2019 1047 by Iantha Fallen, RN Outcome: Progressing   Problem: Clinical Measurements: Goal: Ability to maintain clinical measurements within normal limits will improve 06/29/2019 1049 by Iantha Fallen, RN Outcome: Adequate for Discharge 06/29/2019 1049 by Iantha Fallen, RN Outcome: Adequate for Discharge 06/29/2019 1047 by Iantha Fallen, RN Outcome: Progressing Goal: Will remain free from infection 06/29/2019 1049 by Iantha Fallen, RN Outcome: Adequate for Discharge 06/29/2019 1049 by Iantha Fallen, RN Outcome: Adequate for Discharge 06/29/2019 1047 by Iantha Fallen, RN Outcome: Progressing Goal: Diagnostic test results will improve 06/29/2019 1049 by Iantha Fallen, RN Outcome: Adequate for Discharge 06/29/2019 1049 by Iantha Fallen, RN Outcome: Adequate for Discharge 06/29/2019 1047 by Iantha Fallen, RN Outcome: Progressing Goal: Respiratory complications will improve 06/29/2019 1049 by Iantha Fallen, RN Outcome: Adequate for Discharge 06/29/2019 1049 by Iantha Fallen, RN Outcome: Adequate for Discharge 06/29/2019 1047 by Iantha Fallen, RN Outcome: Progressing Goal: Cardiovascular complication will be avoided 06/29/2019 1049 by Iantha Fallen, RN Outcome: Adequate for Discharge 06/29/2019 1049 by Iantha Fallen,  RN Outcome: Adequate for Discharge 06/29/2019 1047 by Iantha Fallen, RN Outcome: Progressing   Problem: Activity: Goal: Risk for activity intolerance will decrease 06/29/2019 1049 by Iantha Fallen, RN Outcome: Adequate for Discharge 06/29/2019 1049 by Iantha Fallen, RN Outcome: Adequate for Discharge 06/29/2019 1047 by Iantha Fallen, RN Outcome: Progressing   Problem: Nutrition: Goal: Adequate nutrition will be maintained 06/29/2019 1049 by Iantha Fallen, RN Outcome: Adequate for Discharge 06/29/2019 1049 by Iantha Fallen, RN Outcome: Adequate for Discharge 06/29/2019 1047 by Iantha Fallen, RN Outcome: Progressing   Problem: Coping: Goal: Level of anxiety will decrease 06/29/2019 1049 by Iantha Fallen, RN Outcome: Adequate for Discharge 06/29/2019 1049 by Iantha Fallen, RN Outcome: Adequate for Discharge 06/29/2019 1047 by Iantha Fallen, RN Outcome: Progressing   Problem: Elimination: Goal: Will not experience complications related to bowel motility 06/29/2019 1049 by Iantha Fallen, RN Outcome: Adequate for Discharge 06/29/2019 1049 by Iantha Fallen, RN Outcome: Adequate for Discharge 06/29/2019 1047 by Iantha Fallen, RN Outcome: Progressing Goal: Will not experience complications related to urinary retention 06/29/2019 1049 by Iantha Fallen, RN Outcome: Adequate for Discharge 06/29/2019 1049 by Iantha Fallen, RN Outcome: Adequate for Discharge 06/29/2019 1047 by Iantha Fallen, RN Outcome: Progressing   Problem: Pain Managment: Goal: General experience of comfort will improve 06/29/2019 1049 by Iantha Fallen, RN Outcome: Adequate for Discharge 06/29/2019 1049 by Iantha Fallen, RN Outcome: Adequate for Discharge 06/29/2019 1047 by Iantha Fallen, RN Outcome: Progressing   Problem: Safety: Goal: Ability to remain free from injury will improve 06/29/2019 1049 by Iantha Fallen, RN Outcome: Adequate for Discharge 06/29/2019 1049 by Iantha Fallen, RN Outcome: Adequate for Discharge 06/29/2019 1047 by Iantha Fallen, RN Outcome: Progressing   Problem: Skin Integrity: Goal: Risk for impaired skin integrity will  decrease 06/29/2019 1049 by Hubert Azure, RN Outcome: Adequate for Discharge 06/29/2019 1049 by Hubert Azure, RN Outcome: Adequate for Discharge 06/29/2019 1047 by Hubert Azure, RN Outcome: Progressing   Problem: Education: Goal: Required Educational Video(s) 06/29/2019 1049 by Hubert Azure, RN Outcome: Adequate for Discharge 06/29/2019 1049 by Hubert Azure, RN Outcome: Adequate for Discharge 06/29/2019 1047 by Hubert Azure, RN Outcome: Progressing   Problem: Clinical Measurements: Goal: Postoperative complications will be avoided or minimized 06/29/2019 1049 by Hubert Azure, RN Outcome: Adequate for Discharge 06/29/2019 1049 by Hubert Azure, RN Outcome: Adequate for Discharge 06/29/2019 1047 by Hubert Azure, RN Outcome: Progressing   Problem: Skin Integrity: Goal: Demonstration of wound healing without infection will improve 06/29/2019 1049 by Hubert Azure, RN Outcome: Adequate for Discharge 06/29/2019 1049 by Hubert Azure, RN Outcome: Adequate for Discharge 06/29/2019 1047 by Hubert Azure, RN Outcome: Progressing

## 2019-06-29 NOTE — Discharge Summary (Signed)
Patient ID: Matilde Markie [id number] February 23, 1966 54 y.o.  Admit date: 06/28/2019 Discharge date: 06/29/2019  Admitting Diagnosis: Perirectal abscess  Discharge Diagnosis Patient Active Problem List   Diagnosis Date Noted  . Perirectal abscess 06/28/2019    Consultants none  Reason for Admission: This is a 54 year old white male with a history of gout who was doing a lot of heavy lifting rolling side over the weekend.  He states that he felt like his buttocks became raw during this time.  On Monday he began having worsening perirectal pain.  He felt a lot of pressure in this area.  Nothing seemed to help the pain.  He went finally yesterday and saw his primary care physician who felt like he may have a abscess deep in the tissue.  He ordered oral clindamycin for the patient.  The patient has taken 3 doses of this.  He states since that visit he feels like this has significantly increased in size and pressure.  He has had some chills but no fevers.  Due to worsening pain, the patient presented to the emergency department today for evaluation.  He underwent an attempted incision and drainage in the ER with no success.  He has a CT scan of the pelvis that reveals a perirectal abscess.  The patient has an elevated white blood cell count around 15,000.  The rest of his labs are normal.  He denies any other complaints.  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  He does state that he has had difficulty moving his bowels secondary to pain.  Because of these findings we have been asked to evaluate the patient for further evaluation and recommendations.  Procedures EUA with I&D of perirectal abscess, Dr. Johney Maine 06/28/19  Hospital Course:  The patient was admitted and underwent the above procedure.  The patient tolerated the procedure well.  On POD 1, the patient was tolerating a regular diet, voiding well, mobilizing, and pain was controlled with oral pain medications.   His packing was removed and his wound was clean.  No packing was replaced.  The patient was stable for DC home at this time with appropriate follow up made.   Physical Exam: Skin: wound is clean after packing removed. No further purulent drainage or bleeding.  Induration and erythema improved..  Allergies as of 06/29/2019      Reactions   Penicillins       Medication List    STOP taking these medications   amoxicillin-clavulanate 500-125 MG tablet Commonly known as: AUGMENTIN   clindamycin 150 MG capsule Commonly known as: Cleocin     TAKE these medications   acetaminophen 500 MG tablet Commonly known as: TYLENOL Take 2 tablets (1,000 mg total) by mouth every 6 (six) hours.   allopurinol 100 MG tablet Commonly known as: ZYLOPRIM Take 100 mg by mouth daily.   ciprofloxacin 500 MG tablet Commonly known as: Cipro Take 1 tablet (500 mg total) by mouth 2 (two) times daily for 5 days.   GOLD BOND EX Apply 1 application topically as needed (irritation).   ibuprofen 200 MG tablet Commonly known as: ADVIL Take 800 mg by mouth every 6 (six) hours as needed for headache or moderate pain.   metroNIDAZOLE 500 MG tablet Commonly known as: Flagyl Take 1 tablet (500 mg total) by mouth 3 (three) times daily for 5 days.   naproxen sodium 220 MG tablet Commonly known as: ALEVE Take 440  mg by mouth 2 (two) times daily as needed (pain).   oxyCODONE 5 MG immediate release tablet Commonly known as: Oxy IR/ROXICODONE Take 1-2 tablets (5-10 mg total) by mouth every 6 (six) hours as needed for moderate pain, severe pain or breakthrough pain.   polyethylene glycol 17 g packet Commonly known as: MIRALAX / GLYCOLAX Take 17 g by mouth daily.   testosterone cypionate 200 MG/ML injection Commonly known as: DEPOTESTOSTERONE CYPIONATE Inject 200 mg into the muscle every 14 (fourteen) days.   traZODone 150 MG tablet Commonly known as: DESYREL Take 150 mg by mouth daily.         Follow-up Information    [facility], [facility] Follow up on 07/10/2019.   Specialty: General Surgery Why: 10:30am, arrive by 10:00am for paperwork and check in process.  please bring insurance card and photo ID Contact information: [address] [place] [address] [phone]           Signed: Barnetta Chapel, [facility] 06/29/2019, 8:59 AM Please see Amion for pager number during day hours 7:00am-4:30pm, 7-11:30am on Weekends

## 2019-12-31 ENCOUNTER — Other Ambulatory Visit: Payer: Self-pay

## 2019-12-31 ENCOUNTER — Encounter (HOSPITAL_COMMUNITY): Payer: Self-pay | Admitting: Emergency Medicine

## 2019-12-31 ENCOUNTER — Inpatient Hospital Stay (HOSPITAL_COMMUNITY)
Admission: EM | Admit: 2019-12-31 | Discharge: 2020-01-05 | DRG: 871 | Disposition: A | Discharge status: Home / Self Care | Payer: 59 | Attending: Internal Medicine | Admitting: Internal Medicine

## 2019-12-31 ENCOUNTER — Emergency Department (HOSPITAL_COMMUNITY): Payer: 59

## 2019-12-31 DIAGNOSIS — R6520 Severe sepsis without septic shock: Secondary | ICD-10-CM | POA: Diagnosis present

## 2019-12-31 DIAGNOSIS — Z6834 Body mass index (BMI) 34.0-34.9, adult: Secondary | ICD-10-CM | POA: Diagnosis not present

## 2019-12-31 DIAGNOSIS — R0902 Hypoxemia: Secondary | ICD-10-CM

## 2019-12-31 DIAGNOSIS — E669 Obesity, unspecified: Secondary | ICD-10-CM | POA: Diagnosis present

## 2019-12-31 DIAGNOSIS — J9601 Acute respiratory failure with hypoxia: Secondary | ICD-10-CM | POA: Diagnosis present

## 2019-12-31 DIAGNOSIS — Z8249 Family history of ischemic heart disease and other diseases of the circulatory system: Secondary | ICD-10-CM | POA: Diagnosis not present

## 2019-12-31 DIAGNOSIS — A4189 Other specified sepsis: Secondary | ICD-10-CM | POA: Diagnosis present

## 2019-12-31 DIAGNOSIS — E872 Acidosis, unspecified: Secondary | ICD-10-CM | POA: Diagnosis present

## 2019-12-31 DIAGNOSIS — Z8616 Personal history of COVID-19: Secondary | ICD-10-CM

## 2019-12-31 DIAGNOSIS — E781 Pure hyperglyceridemia: Secondary | ICD-10-CM | POA: Diagnosis present

## 2019-12-31 DIAGNOSIS — Z79899 Other long term (current) drug therapy: Secondary | ICD-10-CM

## 2019-12-31 DIAGNOSIS — R739 Hyperglycemia, unspecified: Secondary | ICD-10-CM | POA: Diagnosis present

## 2019-12-31 DIAGNOSIS — Z833 Family history of diabetes mellitus: Secondary | ICD-10-CM

## 2019-12-31 DIAGNOSIS — R7989 Other specified abnormal findings of blood chemistry: Secondary | ICD-10-CM | POA: Diagnosis not present

## 2019-12-31 DIAGNOSIS — E291 Testicular hypofunction: Secondary | ICD-10-CM | POA: Diagnosis present

## 2019-12-31 DIAGNOSIS — J1282 Pneumonia due to coronavirus disease 2019: Secondary | ICD-10-CM | POA: Diagnosis present

## 2019-12-31 DIAGNOSIS — M109 Gout, unspecified: Secondary | ICD-10-CM | POA: Diagnosis present

## 2019-12-31 DIAGNOSIS — U071 COVID-19: Secondary | ICD-10-CM | POA: Diagnosis present

## 2019-12-31 DIAGNOSIS — J069 Acute upper respiratory infection, unspecified: Secondary | ICD-10-CM | POA: Diagnosis not present

## 2019-12-31 DIAGNOSIS — Z809 Family history of malignant neoplasm, unspecified: Secondary | ICD-10-CM

## 2019-12-31 DIAGNOSIS — R6510 Systemic inflammatory response syndrome (SIRS) of non-infectious origin without acute organ dysfunction: Secondary | ICD-10-CM | POA: Diagnosis present

## 2019-12-31 HISTORY — DX: Personal history of COVID-19: Z86.16

## 2019-12-31 LAB — COMPREHENSIVE METABOLIC PANEL
ALT: 44 U/L (ref 0–44)
AST: 49 U/L — ABNORMAL HIGH (ref 15–41)
Albumin: 3.4 g/dL — ABNORMAL LOW (ref 3.5–5.0)
Alkaline Phosphatase: 37 U/L — ABNORMAL LOW (ref 38–126)
Anion gap: 13 (ref 5–15)
BUN: 18 mg/dL (ref 6–20)
CO2: 21 mmol/L — ABNORMAL LOW (ref 22–32)
Calcium: 9 mg/dL (ref 8.9–10.3)
Chloride: 104 mmol/L (ref 98–111)
Creatinine, Ser: 1.15 mg/dL (ref 0.61–1.24)
GFR calc Af Amer: >60 mL/min (ref >60)
GFR calc non Af Amer: >60 mL/min (ref >60)
Glucose, Bld: 136 mg/dL — ABNORMAL HIGH (ref 70–99)
Potassium: 4.1 mmol/L (ref 3.5–5.1)
Sodium: 138 mmol/L (ref 135–145)
Total Bilirubin: 0.7 mg/dL (ref 0.3–1.2)
Total Protein: 6.9 g/dL (ref 6.5–8.1)

## 2019-12-31 LAB — CBC WITH DIFFERENTIAL/PLATELET
Abs Immature Granulocytes: 0.19 10*3/uL — ABNORMAL HIGH (ref 0.00–0.07)
Basophils Absolute: 0 10*3/uL (ref 0.0–0.1)
Basophils Relative: 0 %
Eosinophils Absolute: 0 10*3/uL (ref 0.0–0.5)
Eosinophils Relative: 0 %
HCT: 48.3 % (ref 39.0–52.0)
Hemoglobin: 16.9 g/dL (ref 13.0–17.0)
Immature Granulocytes: 1 %
Lymphocytes Relative: 3 %
Lymphs Abs: 0.6 10*3/uL — ABNORMAL LOW (ref 0.7–4.0)
MCH: 32.1 pg (ref 26.0–34.0)
MCHC: 35 g/dL (ref 30.0–36.0)
MCV: 91.8 fL (ref 80.0–100.0)
Monocytes Absolute: 0.4 10*3/uL (ref 0.1–1.0)
Monocytes Relative: 3 %
Neutro Abs: 16.4 10*3/uL — ABNORMAL HIGH (ref 1.7–7.7)
Neutrophils Relative %: 93 %
Platelets: 142 10*3/uL — ABNORMAL LOW (ref 150–400)
RBC: 5.26 MIL/uL (ref 4.22–5.81)
RDW: 13.1 % (ref 11.5–15.5)
WBC: 17.6 10*3/uL — ABNORMAL HIGH (ref 4.0–10.5)
nRBC: 0 % (ref 0.0–0.2)

## 2019-12-31 LAB — D-DIMER, QUANTITATIVE: D-Dimer, Quant: 0.74 ug/mL-FEU — ABNORMAL HIGH (ref 0.00–0.50)

## 2019-12-31 LAB — URINALYSIS, ROUTINE W REFLEX MICROSCOPIC
Bacteria, UA: NONE SEEN
Bilirubin Urine: NEGATIVE
Glucose, UA: NEGATIVE mg/dL
Hgb urine dipstick: NEGATIVE
Ketones, ur: NEGATIVE mg/dL
Leukocytes,Ua: NEGATIVE
Nitrite: NEGATIVE
Protein, ur: 30 mg/dL — AB
Specific Gravity, Urine: 1.024 (ref 1.005–1.030)
pH: 6 (ref 5.0–8.0)

## 2019-12-31 LAB — PROCALCITONIN: Procalcitonin: 0.13 ng/mL

## 2019-12-31 LAB — PROTIME-INR
INR: 1 (ref 0.8–1.2)
Prothrombin Time: 13.2 seconds (ref 11.4–15.2)

## 2019-12-31 LAB — LACTATE DEHYDROGENASE: LDH: 266 U/L — ABNORMAL HIGH (ref 98–192)

## 2019-12-31 LAB — FERRITIN: Ferritin: 899 ng/mL — ABNORMAL HIGH (ref 24–336)

## 2019-12-31 LAB — HEMOGLOBIN A1C
Hgb A1c MFr Bld: 5.2 % (ref 4.8–5.6)
Mean Plasma Glucose: 102.54 mg/dL

## 2019-12-31 LAB — SARS CORONAVIRUS 2 BY RT PCR (HOSPITAL ORDER, PERFORMED IN ~~LOC~~ HOSPITAL LAB): SARS Coronavirus 2: POSITIVE — AB

## 2019-12-31 LAB — FIBRINOGEN: Fibrinogen: >800 mg/dL — ABNORMAL HIGH (ref 210–475)

## 2019-12-31 LAB — MAGNESIUM: Magnesium: 1.9 mg/dL (ref 1.7–2.4)

## 2019-12-31 LAB — C-REACTIVE PROTEIN: CRP: 19.6 mg/dL — ABNORMAL HIGH (ref <1.0)

## 2019-12-31 LAB — LACTIC ACID, PLASMA
Lactic Acid, Venous: 2.8 mmol/L (ref 0.5–1.9)
Lactic Acid, Venous: 1.4 mmol/L (ref 0.5–1.9)

## 2019-12-31 LAB — TRIGLYCERIDES: Triglycerides: 86 mg/dL (ref <150)

## 2019-12-31 MED ORDER — ACETAMINOPHEN 650 MG RE SUPP: 650.0000 mg | Freq: Four times a day (QID) | RECTAL | Status: DC | PRN

## 2019-12-31 MED ORDER — METHYLPREDNISOLONE SODIUM SUCC 125 MG IJ SOLR
0.5000 mg/kg | Freq: Two times a day (BID) | INTRAMUSCULAR | Status: DC
  Administered 2019-12-31 – 2020-01-05 (×11): 54.375 mg via INTRAVENOUS
  Filled 2019-12-31 (×11): qty 2

## 2019-12-31 MED ORDER — DEXAMETHASONE SODIUM PHOSPHATE 10 MG/ML IJ SOLN
10.0000 mg | Freq: Once | INTRAMUSCULAR | Status: AC
  Administered 2019-12-31: 10 mg via INTRAVENOUS
  Filled 2019-12-31: qty 1

## 2019-12-31 MED ORDER — IPRATROPIUM-ALBUTEROL 20-100 MCG/ACT IN AERS
1.0000 | INHALATION_SPRAY | Freq: Four times a day (QID) | RESPIRATORY_TRACT | Status: DC
  Filled 2019-12-31: qty 4

## 2019-12-31 MED ORDER — OXYCODONE HCL 5 MG PO TABS
5.0000 mg | ORAL_TABLET | Freq: Four times a day (QID) | ORAL | Status: DC | PRN
  Administered 2019-12-31: 10 mg via ORAL
  Administered 2020-01-01: 5 mg via ORAL
  Administered 2020-01-03: 10 mg via ORAL
  Filled 2019-12-31: qty 2
  Filled 2019-12-31: qty 1
  Filled 2019-12-31: qty 2

## 2019-12-31 MED ORDER — GUAIFENESIN-DM 100-10 MG/5ML PO SYRP: 10.0000 mL | ORAL_SOLUTION | ORAL | Status: DC | PRN

## 2019-12-31 MED ORDER — DEXAMETHASONE SODIUM PHOSPHATE 10 MG/ML IJ SOLN: 6.0000 mg | INTRAMUSCULAR | Status: DC

## 2019-12-31 MED ORDER — ALLOPURINOL 100 MG PO TABS
50.0000 mg | ORAL_TABLET | Freq: Every day | ORAL | Status: DC
  Administered 2019-12-31 – 2020-01-03 (×3): 50 mg via ORAL
  Filled 2019-12-31 (×6): qty 0.5

## 2019-12-31 MED ORDER — SODIUM CHLORIDE 0.45 % IV SOLN: INTRAVENOUS | Status: DC

## 2019-12-31 MED ORDER — ENOXAPARIN SODIUM 40 MG/0.4ML ~~LOC~~ SOLN
40.0000 mg | Freq: Every day | SUBCUTANEOUS | Status: DC
  Administered 2019-12-31 – 2020-01-05 (×6): 40 mg via SUBCUTANEOUS
  Filled 2019-12-31 (×6): qty 0.4

## 2019-12-31 MED ORDER — ZINC SULFATE 220 (50 ZN) MG PO CAPS
220.0000 mg | ORAL_CAPSULE | Freq: Every day | ORAL | Status: DC
  Administered 2019-12-31 – 2020-01-05 (×6): 220 mg via ORAL
  Filled 2019-12-31 (×6): qty 1

## 2019-12-31 MED ORDER — ASCORBIC ACID 500 MG PO TABS
500.0000 mg | ORAL_TABLET | Freq: Every day | ORAL | Status: DC
  Administered 2019-12-31 – 2020-01-05 (×6): 500 mg via ORAL
  Filled 2019-12-31 (×6): qty 1

## 2019-12-31 MED ORDER — SODIUM CHLORIDE 0.9 % IV SOLN
100.0000 mg | Freq: Every day | INTRAVENOUS | Status: AC
  Administered 2020-01-01 – 2020-01-04 (×4): 100 mg via INTRAVENOUS
  Filled 2019-12-31 (×4): qty 20

## 2019-12-31 MED ORDER — ACETAMINOPHEN 325 MG PO TABS
650.0000 mg | ORAL_TABLET | Freq: Four times a day (QID) | ORAL | Status: DC | PRN
  Administered 2020-01-01 – 2020-01-04 (×3): 650 mg via ORAL
  Filled 2019-12-31 (×3): qty 2

## 2019-12-31 MED ORDER — SODIUM CHLORIDE 0.9 % IV SOLN
1.0000 g | INTRAVENOUS | Status: DC
  Administered 2019-12-31: 1 g via INTRAVENOUS
  Filled 2019-12-31: qty 10

## 2019-12-31 MED ORDER — LIP MEDEX EX OINT
TOPICAL_OINTMENT | Freq: Once | CUTANEOUS | Status: AC
  Filled 2019-12-31: qty 7

## 2019-12-31 MED ORDER — ONDANSETRON HCL 4 MG PO TABS: 4.0000 mg | ORAL_TABLET | Freq: Four times a day (QID) | ORAL | Status: DC | PRN

## 2019-12-31 MED ORDER — SENNOSIDES-DOCUSATE SODIUM 8.6-50 MG PO TABS: 1.0000 | ORAL_TABLET | Freq: Every evening | ORAL | Status: DC | PRN

## 2019-12-31 MED ORDER — SODIUM CHLORIDE 0.9 % IV SOLN
500.0000 mg | INTRAVENOUS | Status: DC
  Administered 2019-12-31: 500 mg via INTRAVENOUS
  Filled 2019-12-31: qty 500

## 2019-12-31 MED ORDER — TRAZODONE HCL 50 MG PO TABS
150.0000 mg | ORAL_TABLET | Freq: Every day | ORAL | Status: DC
  Administered 2019-12-31: 100 mg via ORAL
  Administered 2020-01-01 – 2020-01-04 (×5): 150 mg via ORAL
  Filled 2019-12-31: qty 1
  Filled 2019-12-31: qty 2
  Filled 2019-12-31 (×2): qty 1
  Filled 2019-12-31 (×3): qty 2
  Filled 2019-12-31: qty 1

## 2019-12-31 MED ORDER — FENOFIBRATE 54 MG PO TABS
54.0000 mg | ORAL_TABLET | Freq: Every day | ORAL | Status: DC
  Administered 2019-12-31 – 2020-01-05 (×6): 54 mg via ORAL
  Filled 2019-12-31 (×6): qty 1

## 2019-12-31 MED ORDER — GUAIFENESIN ER 600 MG PO TB12
600.0000 mg | ORAL_TABLET | Freq: Two times a day (BID) | ORAL | Status: DC
  Administered 2019-12-31 – 2020-01-05 (×11): 600 mg via ORAL
  Filled 2019-12-31 (×11): qty 1

## 2019-12-31 MED ORDER — BENZONATATE 100 MG PO CAPS
100.0000 mg | ORAL_CAPSULE | Freq: Three times a day (TID) | ORAL | Status: DC
  Administered 2019-12-31 – 2020-01-05 (×16): 100 mg via ORAL
  Filled 2019-12-31 (×16): qty 1

## 2019-12-31 MED ORDER — SODIUM CHLORIDE 0.9 % IV SOLN
200.0000 mg | Freq: Once | INTRAVENOUS | Status: AC
  Administered 2019-12-31: 200 mg via INTRAVENOUS
  Filled 2019-12-31: qty 200

## 2019-12-31 MED ORDER — HYDROCOD POLST-CPM POLST ER 10-8 MG/5ML PO SUER: 5.0000 mL | Freq: Two times a day (BID) | ORAL | Status: DC | PRN

## 2019-12-31 MED ORDER — OXYCODONE HCL 5 MG PO TABS: 5.0000 mg | ORAL_TABLET | ORAL | Status: DC | PRN

## 2019-12-31 MED ORDER — ONDANSETRON HCL 4 MG/2ML IJ SOLN: 4.0000 mg | Freq: Four times a day (QID) | INTRAMUSCULAR | Status: DC | PRN

## 2019-12-31 MED ORDER — ALBUTEROL SULFATE HFA 108 (90 BASE) MCG/ACT IN AERS
2.0000 | INHALATION_SPRAY | Freq: Four times a day (QID) | RESPIRATORY_TRACT | Status: DC
  Administered 2019-12-31 – 2020-01-05 (×20): 2 via RESPIRATORY_TRACT
  Filled 2019-12-31: qty 6.7

## 2019-12-31 NOTE — ED Notes (Signed)
Date and time results received: 12/31/19 7:43 AM  Test: Fibrinogen Critical Value: <60  Name of Provider Notified: Azalee Course  Orders Received? Or Actions Taken?: Providers notified.

## 2019-12-31 NOTE — ED Notes (Signed)
Date and time results received: 12/31/19 7:11 AM (use smartphrase ".now" to insert current time)  Test: Fibrinogen Critical Value: <60  Name of Provider Notified: Pahwani  Orders Received? Or Actions Taken?: Orders Received - See Orders for details

## 2019-12-31 NOTE — Progress Notes (Signed)
PROGRESS NOTE    Joshua [name]  XLK:[id number] DOB: Sep 13, 1965 DOA: 12/31/2019 PCP: Joycelyn Rua, MD   Brief Narrative:  HPI: Joshua [name] is a 54 y.o. male with gout, hypertriglyceridemia and hypogonadism presented through EMS with above.   Symptoms started after visiting [facility] 12/22/2019 weekend with generalized fatigue, body aches followed by dry cough dyspnea and mild wheezing. He also reports fever more than 101 at home.  Breathing continued to worsen therefore wife called EMS.  He was noted to be hypoxic to 85% and was initially placed on nonrebreather mask and transition to 3 L nasal cannula.  Patient reports he is not vaccinated for Covid.  Personal social history: He is married with 2 children who he thinks are likely the source of his Covid.  Denies any history of smoking, drinking or recreational drug use.  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  Family history: Denies any history of CAD or cancers.   ED Course: 145/85, pulse 102-110, RR 36 SPO2 97% on 3 L nasal cannula T-max 99 F  CXR with mild hazy bilateral opacities Sodium 138, potassium 4.1, BUN 18, creatinine 1.15 WBC 7.6, Hb 16.9, platelets 142  Albumin 3.4, AST 49, ALP 37 Lactic acid 2.8--> 1.4 Covid PCR positive Blood cultures x2 pending  Assessment & Plan:   Active Problems:   Acute respiratory disease due to COVID-19 virus   Hypertriglyceridemia   Gout   Pneumonia due to COVID-19 virus   Lactic acidosis   SIRS (systemic inflammatory response syndrome) (HCC)   Acute hypoxic respiratory failure and severe sepsis secondary to COVID-19 pneumonia: Patient meets criteria for severe sepsis based on tachycardia, tachypnea, lactic acid of 2.8 and hypoxia.  Patient feels better.  Currently on 2 L of nasal oxygen.  Lungs clear to auscultation.  Chest x-ray shows mild hazy bilateral infiltrates.  Elevated inflammatory markers.  We will switch him to Solu-Medrol and discontinue  dexamethasone.  Continue remdesivir per pharmacy protocol.  He seems to be improving currently so we will hold off to the use of immunomodulators at this point in time.  We will add zinc and vitamin C. Actemra/Baricitinib  off label use - patient was told that if COVID-19 pneumonitis gets worse we might potentially use Actemra off label, patient denies any known history of active diverticulitis, tuberculosis or hepatitis, understands the risks and benefits and wants to proceed with Actemra treatment if required. Patient was encouraged to prone, out of bed to chair, to use incentive spirometry and flutter valve.  Hypertriglyceridemia: Resume home dose of fenofibrate.  DVT prophylaxis: enoxaparin (LOVENOX) injection 40 mg Start: 12/31/19 1200   Code Status: Full Code  Family Communication: None present at bedside.  Plan of care discussed with patient in length and he verbalized understanding and agreed with it.  Status is: Inpatient  Remains inpatient appropriate because:Hemodynamically unstable   Dispo: The patient is from: Home              Anticipated d/c is to: Home              Anticipated d/c date is: 3 days              Patient currently is not medically stable to d/c.        Estimated body mass index is 34.44 kg/m as calculated from the following:   Height as of this encounter: 5\' 10"  (1.778 m).   Weight as of this encounter: 108.9 kg.  Nutritional status:               Consultants:   None  Procedures:   None  Antimicrobials:  Anti-infectives (From admission, onward)   Start     Dose/Rate Route Frequency Ordered Stop   01/01/20 1000  remdesivir 100 mg in sodium chloride 0.9 % 100 mL IVPB        100 mg 200 mL/hr over 30 Minutes Intravenous Daily 12/31/19 0359 01/05/20 0959   12/31/19 0630  cefTRIAXone (ROCEPHIN) 1 g in sodium chloride 0.9 % 100 mL IVPB  Status:  Discontinued        1 g 200 mL/hr over 30 Minutes Intravenous Every 24 hours 12/31/19  0625 12/31/19 0748   12/31/19 0630  azithromycin (ZITHROMAX) 500 mg in sodium chloride 0.9 % 250 mL IVPB  Status:  Discontinued        500 mg 250 mL/hr over 60 Minutes Intravenous Every 24 hours 12/31/19 0625 12/31/19 0748   12/31/19 0500  remdesivir 200 mg in sodium chloride 0.9% 250 mL IVPB        200 mg 580 mL/hr over 30 Minutes Intravenous Once 12/31/19 0359 12/31/19 0536         Subjective: Patient seen and examined in the ED.  He states that his breathing is better than yesterday.  Denied having any other complaint.  Objective: Vitals:   12/31/19 0700 12/31/19 0804 12/31/19 0951 12/31/19 1026  BP: (!) 145/86 (!) 141/86 130/82 132/61  Pulse: 82 84 85 85  Resp: (!) 24 (!) 30 18 18   Temp:  97.9 F (36.6 C)    TempSrc:  Oral    SpO2: 96% 96% 93% 94%  Weight:      Height:        Intake/Output Summary (Last 24 hours) at 12/31/2019 1147 Last data filed at 12/31/2019 0815 Gross per 24 hour  Intake 350 ml  Output --  Net 350 ml   Filed Weights   12/31/19 0137  Weight: 108.9 kg    Examination:  General exam: Appears calm and comfortable  Respiratory system: Clear to auscultation. Respiratory effort normal. Cardiovascular system: S1 & S2 heard, RRR. No JVD, murmurs, rubs, gallops or clicks. No pedal edema. Gastrointestinal system: Abdomen is nondistended, soft and nontender. No organomegaly or masses felt. Normal bowel sounds heard. Central nervous system: Alert and oriented. No focal neurological deficits. Extremities: Symmetric 5 x 5 power. Skin: No rashes, lesions or ulcers Psychiatry: Judgement and insight appear normal. Mood & affect appropriate.    Data Reviewed: I have personally reviewed following labs and imaging studies  CBC: Recent Labs  Lab 12/31/19 0141  WBC 17.6*  NEUTROABS 16.4*  HGB 16.9  HCT 48.3  MCV 91.8  PLT 142*   Basic Metabolic Panel: Recent Labs  Lab 12/31/19 0141  NA 138  K 4.1  CL 104  CO2 21*  GLUCOSE 136*  BUN 18   CREATININE 1.15  CALCIUM 9.0   GFR: Estimated Creatinine Clearance: 90.8 mL/min (by C-G formula based on SCr of 1.15 mg/dL). Liver Function Tests: Recent Labs  Lab 12/31/19 0141  AST 49*  ALT 44  ALKPHOS 37*  BILITOT 0.7  PROT 6.9  ALBUMIN 3.4*   No results for input(s): LIPASE, AMYLASE in the last 168 hours. No results for input(s): AMMONIA in the last 168 hours. Coagulation Profile: Recent Labs  Lab 12/31/19 0141  INR 1.0   Cardiac Enzymes: No results for input(s): CKTOTAL, CKMB, CKMBINDEX, TROPONINI in the  last 168 hours. BNP (last 3 results) No results for input(s): PROBNP in the last 8760 hours. HbA1C: Recent Labs    12/31/19 0539  HGBA1C 5.2   CBG: No results for input(s): GLUCAP in the last 168 hours. Lipid Profile: Recent Labs    12/31/19 0539  TRIG 86   Thyroid Function Tests: No results for input(s): TSH, T4TOTAL, FREET4, T3FREE, THYROIDAB in the last 72 hours. Anemia Panel: Recent Labs    12/31/19 0539  FERRITIN 899*   Sepsis Labs: Recent Labs  Lab 12/31/19 0141 12/31/19 0350 12/31/19 0539  PROCALCITON  --   --  0.13  LATICACIDVEN 2.8* 1.4  --     Recent Results (from the past 240 hour(s))  SARS Coronavirus 2 by RT PCR (hospital order, performed in [facility] hospital lab) Nasopharyngeal Nasopharyngeal Swab     Status: Abnormal   Collection Time: 12/31/19  1:41 AM   Specimen: Nasopharyngeal Swab  Result Value Ref Range Status   SARS Coronavirus 2 POSITIVE (A) NEGATIVE Final    Comment: RESULT CALLED TO, READ BACK BY AND VERIFIED WITH: S,DOSTER AT 0340 ON 12/31/19 BY A,MOHAMED (NOTE) SARS-CoV-2 target nucleic acids are DETECTED  SARS-CoV-2 RNA is generally detectable in upper respiratory specimens  during the acute phase of infection.  Positive results are indicative  of the presence of the identified virus, but do not rule out bacterial infection or co-infection with other pathogens not detected by the test.  Clinical correlation  with patient history and  other diagnostic information is necessary to determine patient infection status.  The expected result is negative.  Fact Sheet for Patients:   BoilerBrush.com.cy   Fact Sheet for Healthcare Providers:   https://pope.com/    This test is not yet approved or cleared by the [place] FDA and  has been authorized for detection and/or diagnosis of SARS-CoV-2 by FDA under an Emergency Use Authorization (EUA).  This EUA will remain in effect (meaning t his test can be used) for the duration of  the COVID-19 declaration under Section 564(b)(1) of the Act, 21 U.S.C. section 360-bbb-3(b)(1), unless the authorization is terminated or revoked sooner.  Performed at [facility], [address], [place], [place] [address]       Radiology Studies: [facility] Chest Port 1 View  Result Date: 12/31/2019 CLINICAL DATA:  Shortness of breath, cough and fever. EXAM: PORTABLE CHEST 1 VIEW COMPARISON:  None. FINDINGS: Mild hazy infiltrates are seen involving the bilateral lung bases and mid left lung. There is no evidence of a pleural effusion or pneumothorax. The cardiac silhouette is mildly enlarged. The visualized skeletal structures are unremarkable. IMPRESSION: Mild hazy bilateral infiltrates. Electronically Signed   By: Aram Candela M.D.   On: 12/31/2019 02:16    Scheduled Meds: . allopurinol  50 mg Oral Daily  . benzonatate  100 mg Oral TID  . [START ON 01/01/2020] dexamethasone (DECADRON) injection  6 mg Intravenous Q24H  . enoxaparin (LOVENOX) injection  40 mg Subcutaneous Daily  . guaiFENesin  600 mg Oral BID  . traZODone  150 mg Oral Daily   Continuous Infusions: . sodium chloride 75 mL/hr at 12/31/19 0645  . [START ON 01/01/2020] remdesivir 100 mg in NS 100 mL       LOS: 0 days   Time spent: 35 minutes  Hughie Closs, MD [facility]  12/31/2019, 11:47 AM   To contact the attending  provider between 7A-7P or the covering provider during after hours 7P-7A, please log  into the web site [email or web address].

## 2019-12-31 NOTE — ED Notes (Signed)
Date and time results received: 12/31/19 9:08 AM  (use smartphrase ".now" to insert current time)  Test: Fibrinogen Critical Value: >800  Name of Provider Notified: Pahwani  Orders Received? Or Actions Taken?: Orders Received - See Orders for details

## 2019-12-31 NOTE — ED Notes (Signed)
Pt assisted to bedside commode. Pt had loose bm. Pt said he "got a headache and it was hard to breathe" so he ambulated himself back to the bed. Pt O2 sat at 84 when tech entered the room with pt in bed.

## 2019-12-31 NOTE — Progress Notes (Signed)
Flutter valve not given due to product being on back order. RT management aware.

## 2019-12-31 NOTE — ED Notes (Addendum)
Date and time results received: 12/31/19 3:41 AM  (use smartphrase ".now" to insert current time)  Test: COVID Critical Value: POSITIVE  Name of Provider Notified: Lisa, PA  Orders Received? Or Actions Taken?: See orders 

## 2019-12-31 NOTE — Progress Notes (Signed)
Pharmacy: Lovenox  Patient is a 54 y.o M admitted to the ED on 9/13 with COVID-19.  Pharmacy is consulted to started lovenox for VTE prophylaxis.  - BMI <35 - D dimer <5 - scr 1.15 (crcl~91) - cbc ok  Plan: - lovenox 40 mg SQ daily - With good renal function, pharmacy will sign off for lovenox consult.  Reconsult [place] if need further assistance.  Dorna Leitz, PharmD, BCPS 12/31/2019 10:14 AM

## 2019-12-31 NOTE — ED Triage Notes (Signed)
Pt BIB EMS c/o COVID-like sx (cough, fever, hypoxia, shob). Pt was tested for COVID on 9/10, but still waiting for results. Temp 101.5. 1000 mg tylenol given by EMS. LS clear bilaterally. 85% RA and now 94% NRB 10L. A&O, ambulatory, independent.   120 HR 146/88

## 2019-12-31 NOTE — ED Provider Notes (Signed)
[facility]-EMERGENCY DEPT Provider Note   CSN: [id number] Arrival date & time: 12/31/19  0103     History Chief Complaint  Patient presents with  . Shortness of Breath    Joshua [name] is a 54 y.o. male.  The history is provided by the patient and medical records.   54 y.o. M with hx of gout and low testosterone, presenting to the ED for cough, SOB, and overall fatigue.  States he was at the beach last week, arrived 9/3 and developed symptoms the following day.  States he and his wife were sick the entire week with cough, body aches, poor PO intake, etc.  States on their way home they did stop at an urgent care on 9/10 and both were covid tested but have not gotten their results yet.  States today he was simply trying to unload their truck when all of a sudden he felt like he was suffocating.  States he almost passed out but never lost consciousness.  Wife called EMS and was noted to be 85% on room air.  Placed on a nonrebreather with saturations improved to around 94%.  He is generally not O2 dependent.  Noted to have fever up to 101.32F with EMS, given tylenol.  He has not been vaccinated against covid-19.  Past Medical History:  Diagnosis Date  . Gout   . Gout   . Low testosterone     Patient Active Problem List   Diagnosis Date Noted  . Perirectal abscess 06/28/2019    Past Surgical History:  Procedure Laterality Date  . APPENDECTOMY    . CHOLECYSTECTOMY    . INCISION AND DRAINAGE PERIRECTAL ABSCESS N/A 06/28/2019   Procedure: IRRIGATION AND DEBRIDEMENT PERIRECTAL ABSCESS;  Surgeon: Karie Soda, MD;  Location: [facility] ORS;  Service: General;  Laterality: N/A;       Family History  Problem Relation Age of Onset  . Heart failure Mother   . Diabetes Mother   . Cancer Father   . Diabetes Father     Social History   Tobacco Use  . Smoking status: Unknown If Ever Smoked  . Smokeless tobacco: Current User    Types: Snuff  Vaping Use  . Vaping Use:  Never used  Substance Use Topics  . Alcohol use: Yes    Comment: several shots 06/27/2019 in am   . Drug use: Never    Home Medications Prior to Admission medications   Medication Sig Start Date End Date Taking? Authorizing Provider  acetaminophen (TYLENOL) 500 MG tablet Take 2 tablets (1,000 mg total) by mouth every 6 (six) hours. 06/29/19   Barnetta Chapel, PA-C  allopurinol (ZYLOPRIM) 100 MG tablet Take 100 mg by mouth daily.    [provider]  ibuprofen (ADVIL) 200 MG tablet Take 800 mg by mouth every 6 (six) hours as needed for headache or moderate pain.    [provider]  Menthol-Zinc Oxide (GOLD BOND EX) Apply 1 application topically as needed (irritation).    [provider]  naproxen sodium (ALEVE) 220 MG tablet Take 440 mg by mouth 2 (two) times daily as needed (pain).    [provider]  oxyCODONE (OXY IR/ROXICODONE) 5 MG immediate release tablet Take 1-2 tablets (5-10 mg total) by mouth every 6 (six) hours as needed for moderate pain, severe pain or breakthrough pain. 06/29/19   Barnetta Chapel, PA-C  polyethylene glycol (MIRALAX / GLYCOLAX) 17 g packet Take 17 g by mouth daily. 06/29/19   Barnetta Chapel,  PA-C  testosterone cypionate (DEPOTESTOSTERONE CYPIONATE) 200 MG/ML injection Inject 200 mg into the muscle every 14 (fourteen) days. 05/21/19   [provider]  traZODone (DESYREL) 150 MG tablet Take 150 mg by mouth daily. 03/23/19   [provider]    Allergies    Penicillins  Review of Systems   Review of Systems  Respiratory: Positive for cough and shortness of breath.   All other systems reviewed and are negative.   Physical Exam Updated Vital Signs BP (!) 145/85 (BP Location: Right Arm)   Pulse (!) 102   Temp 99 F (37.2 C) (Oral)   Resp 20   Ht 5\' 10"  (1.778 m)   Wt 108.9 kg   SpO2 95%   BMI 34.44 kg/m   Physical Exam Vitals and nursing note reviewed.  Constitutional:      Appearance: He is  well-developed.  HENT:     Head: Normocephalic and atraumatic.  Eyes:     Conjunctiva/sclera: Conjunctivae normal.     Pupils: Pupils are equal, round, and reactive to light.  Cardiovascular:     Rate and Rhythm: Normal rate and regular rhythm.     Heart sounds: Normal heart sounds.  Pulmonary:     Effort: Pulmonary effort is normal. No respiratory distress.     Breath sounds: Normal breath sounds. No rhonchi.     Comments: 4L via [place] in use, able to speak in sentences, maintaining saturations around 95% during exam Abdominal:     General: Bowel sounds are normal.     Palpations: Abdomen is soft.  Musculoskeletal:        General: Normal range of motion.     Cervical back: Normal range of motion.  Skin:    General: Skin is warm and dry.  Neurological:     Mental Status: He is alert and oriented to person, place, and time.     ED Results / Procedures / Treatments   Labs (all labs ordered are listed, but only abnormal results are displayed) Labs Reviewed  SARS CORONAVIRUS 2 BY RT PCR (HOSPITAL ORDER, PERFORMED IN [facility]) - Abnormal; Notable for the following components:      Result Value   SARS Coronavirus 2 POSITIVE (*)    All other components within normal limits  COMPREHENSIVE METABOLIC PANEL - Abnormal; Notable for the following components:   CO2 21 (*)    Glucose, Bld 136 (*)    Albumin 3.4 (*)    AST 49 (*)    Alkaline Phosphatase 37 (*)    All other components within normal limits  LACTIC ACID, PLASMA - Abnormal; Notable for the following components:   Lactic Acid, Venous 2.8 (*)    All other components within normal limits  CBC WITH DIFFERENTIAL/PLATELET - Abnormal; Notable for the following components:   WBC 17.6 (*)    Platelets 142 (*)    Neutro Abs 16.4 (*)    Lymphs Abs 0.6 (*)    Abs Immature Granulocytes 0.19 (*)    All other components within normal limits  URINALYSIS, ROUTINE W REFLEX MICROSCOPIC - Abnormal; Notable for the following  components:   Protein, ur 30 (*)    All other components within normal limits  CULTURE, BLOOD (ROUTINE X 2)  CULTURE, BLOOD (ROUTINE X 2)  LACTIC ACID, PLASMA  PROTIME-INR  D-DIMER, QUANTITATIVE (NOT AT [facility])  PROCALCITONIN  LACTATE DEHYDROGENASE  FERRITIN  TRIGLYCERIDES  FIBRINOGEN  C-REACTIVE PROTEIN    EKG None  Radiology DG  Chest Port 1 View  Result Date: 12/31/2019 CLINICAL DATA:  Shortness of breath, cough and fever. EXAM: PORTABLE CHEST 1 VIEW COMPARISON:  None. FINDINGS: Mild hazy infiltrates are seen involving the bilateral lung bases and mid left lung. There is no evidence of a pleural effusion or pneumothorax. The cardiac silhouette is mildly enlarged. The visualized skeletal structures are unremarkable. IMPRESSION: Mild hazy bilateral infiltrates. Electronically Signed   By: Aram Candela M.D.   On: 12/31/2019 02:16    Procedures Procedures (including critical care time)  Medications Ordered in ED Medications - No data to display  ED Course  I have reviewed the triage vital signs and the nursing notes.  Pertinent labs & imaging results that were available during my care of the patient were reviewed by me and considered in my medical decision making (see chart for details).    MDM Rules/Calculators/A&P  54 year old male presenting to the ED with shortness of breath.  Has been sick for the past week with dry cough, fatigue, and body aches.  Had Covid test on 12/28/2019, however still awaiting results.  EMS called by wife today due to near syncopal event.  Was found to be febrile and hypoxic to 85% on room air.  He was started on nonrebreather but tapered down to 4L and has been tolerating this well.  High index of suspicion for COVID-19.  As we do not have positive test in the system, will retest today.  Labs and chest x-ray pending.  Chest x-ray with hazy bilateral infiltrates.  Labs with leukocytosis and elevated lactate.  Repeat lactate has cleared without any  acute intervention here.  His COVID-19 test is positive.  He continues requiring supplemental oxygen.  Given dose of Decadron and will start remdesivir.  He will require admission given O2 requirement.  Discussed with hospitalist, Dr. Earney Mallet-- will admit for ongoing care.  Final Clinical Impression(s) / ED Diagnoses Final diagnoses:  Acute respiratory failure with hypoxia [facility])  COVID-19    Rx / DC Orders ED Discharge Orders    None       Garlon Hatchet, PA-C 12/31/19 0549    Paula Libra, MD 12/31/19 [phone]

## 2019-12-31 NOTE — H&P (Addendum)
History and Physical    Joshua [name] KKX:[id number] DOB: Dec 27, 1965 DOA: 12/31/2019  PCP: Joycelyn Rua, MD   Patient coming from: Home   I have personally briefly reviewed patient's old medical records in [facility]  Chief Complaint: fever, chills and dyspnea.   HPI: Joshua [name] is a 54 y.o. male with gout, hypertriglyceridemia and hypogonadism presented through EMS with above.   Symptoms started after visiting [facility] 12/22/2019 weekend with generalized fatigue, body aches followed by dry cough dyspnea and mild wheezing. He also reports fever more than 101 at home.  Breathing continued to worsen therefore wife called EMS.  He was noted to be hypoxic to 85% and was initially placed on nonrebreather mask and transition to 3 L nasal cannula.  Patient reports he is not vaccinated for Covid.  Personal social history: He is married with 2 children who he thinks are likely the source of his Covid.  Denies any history of smoking, drinking or recreational drug use. 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  Family history: Denies any history of CAD or cancers.   ED Course: 145/85, pulse 102-110, RR 36 SPO2 97% on 3 L nasal cannula T-max 99 F  CXR with mild hazy bilateral opacities Sodium 138, potassium 4.1, BUN 18, creatinine 1.15 WBC 7.6, Hb 16.9, platelets 142  Albumin 3.4, AST 49, ALP 37 Lactic acid 2.8--> 1.4 Covid PCR positive Blood cultures x2 pending  ED meds: Remdesivir Decadron.  Review of Systems: As per HPI otherwise 10 point review of systems negative.  Review of Systems  Constitutional: Positive for chills, diaphoresis, fever and malaise/fatigue. Negative for weight loss.  HENT: Positive for congestion.   Eyes: Negative.   Respiratory: Positive for cough, shortness of breath and wheezing. Negative for hemoptysis and sputum production.   Cardiovascular: Negative for chest pain, palpitations, orthopnea, claudication, leg swelling and PND.   Gastrointestinal: Positive for nausea.  Genitourinary: Negative.   Musculoskeletal: Positive for myalgias.  Skin: Negative.   Neurological: Positive for dizziness and weakness. Negative for tingling, tremors, sensory change, focal weakness, seizures, loss of consciousness and headaches.  Endo/Heme/Allergies: Negative.   Psychiatric/Behavioral: Negative.     Past Medical History:  Diagnosis Date  . Gout   . Gout   . Low testosterone     Past Surgical History:  Procedure Laterality Date  . APPENDECTOMY    . CHOLECYSTECTOMY    . INCISION AND DRAINAGE PERIRECTAL ABSCESS N/A 06/28/2019   Procedure: IRRIGATION AND DEBRIDEMENT PERIRECTAL ABSCESS;  Surgeon: Karie Soda, MD;  Location: [facility];  Service: General;  Laterality: N/A;     has an unknown smoking status. His smokeless tobacco use includes snuff. He reports current alcohol use. He reports that he does not use drugs.  Allergies  Allergen Reactions  . Penicillins     Has taken Penicillins as an adult     Family History  Problem Relation Age of Onset  . Heart failure Mother   . Diabetes Mother   . Cancer Father   . Diabetes Father     Prior to Admission medications   Medication Sig Start Date End Date Taking? Authorizing Provider  acetaminophen (TYLENOL) 500 MG tablet Take 2 tablets (1,000 mg total) by mouth every 6 (six) hours. 06/29/19  Yes Barnetta Chapel, PA-C  allopurinol (ZYLOPRIM) 100 MG tablet Take 50 mg by mouth daily.    Yes [provider]  butalbital-acetaminophen-caffeine (FIORICET) 50-325-40 MG tablet Take 1 tablet by mouth every 8 (eight)  hours. 12/28/19  Yes [provider]  fenofibrate 54 MG tablet Take 54 mg by mouth daily. 11/26/19  Yes [provider]  testosterone cypionate (DEPOTESTOSTERONE CYPIONATE) 200 MG/ML injection Inject 200 mg into the muscle every 14 (fourteen) days. 05/21/19  Yes [provider]  traZODone (DESYREL) 150 MG tablet Take 150 mg by mouth daily.  03/23/19  Yes [provider]  ibuprofen (ADVIL) 200 MG tablet Take 800 mg by mouth every 6 (six) hours as needed for headache or moderate pain.    [provider]  Menthol-Zinc Oxide (GOLD BOND EX) Apply 1 application topically as needed (irritation).    [provider]  naproxen sodium (ALEVE) 220 MG tablet Take 440 mg by mouth 2 (two) times daily as needed (pain).    [provider]  oxyCODONE (OXY IR/ROXICODONE) 5 MG immediate release tablet Take 1-2 tablets (5-10 mg total) by mouth every 6 (six) hours as needed for moderate pain, severe pain or breakthrough pain. 06/29/19   Barnetta Chapel, PA-C  polyethylene glycol (MIRALAX / GLYCOLAX) 17 g packet Take 17 g by mouth daily. 06/29/19   Barnetta Chapel, PA-C    Physical Exam: Vitals:   12/31/19 0430 12/31/19 0431 12/31/19 0500 12/31/19 0530  BP: 140/84  131/79 138/77  Pulse: 89  82 79  Resp: (!) 28  (!) 34 17  Temp:  98.3 F (36.8 C)    TempSrc:  Oral    SpO2: 92%  96% 95%  Weight:      Height:        Constitutional: NAD, calm, in mild to moderate respite distress  Vitals:   12/31/19 0430 12/31/19 0431 12/31/19 0500 12/31/19 0530  BP: 140/84  131/79 138/77  Pulse: 89  82 79  Resp: (!) 28  (!) 34 17  Temp:  98.3 F (36.8 C)    TempSrc:  Oral    SpO2: 92%  96% 95%  Weight:      Height:       Eyes: PERRL, lids and conjunctivae normal ENMT: Mucous membranes are moist. Posterior pharynx clear of any exudate or lesions.Normal dentition.  Neck: normal, supple, no masses, no thyromegaly Respiratory: Mild wheezing with decreased air entry bilaterally.  Cardiovascular: Regular rate and rhythm, no murmurs / rubs / gallops. No extremity edema. 2+ pedal pulses. No carotid bruits.   Abdomen: no tenderness, no masses palpated. No hepatosplenomegaly. Bowel sounds positive.  Musculoskeletal: no clubbing / cyanosis. No joint deformity upper and lower extremities. Good ROM, no contractures. Normal muscle  tone.  Skin: no rashes, lesions, ulcers. No induration Neurologic: CN 2-12 grossly intact. Sensation intact, DTR normal. Strength 5/5 in all 4.  Psychiatric: Normal judgment and insight. Alert and oriented x 3. Normal mood.   Labs on Admission: I have personally reviewed following labs and imaging studies  CBC: Recent Labs  Lab 12/31/19 0141  WBC 17.6*  NEUTROABS 16.4*  HGB 16.9  HCT 48.3  MCV 91.8  PLT 142*   Basic Metabolic Panel: Recent Labs  Lab 12/31/19 0141  NA 138  K 4.1  CL 104  CO2 21*  GLUCOSE 136*  BUN 18  CREATININE 1.15  CALCIUM 9.0   GFR: Estimated Creatinine Clearance: 90.8 mL/min (by C-G formula based on SCr of 1.15 mg/dL). Liver Function Tests: Recent Labs  Lab 12/31/19 0141  AST 49*  ALT 44  ALKPHOS 37*  BILITOT 0.7  PROT 6.9  ALBUMIN 3.4*   No results for input(s): LIPASE, AMYLASE in the  last 168 hours. No results for input(s): AMMONIA in the last 168 hours. Coagulation Profile: Recent Labs  Lab 12/31/19 0141  INR 1.0   Cardiac Enzymes: No results for input(s): CKTOTAL, CKMB, CKMBINDEX, TROPONINI in the last 168 hours. BNP (last 3 results) No results for input(s): PROBNP in the last 8760 hours. HbA1C: No results for input(s): HGBA1C in the last 72 hours. CBG: No results for input(s): GLUCAP in the last 168 hours. Lipid Profile: No results for input(s): CHOL, HDL, LDLCALC, TRIG, CHOLHDL, LDLDIRECT in the last 72 hours. Thyroid Function Tests: No results for input(s): TSH, T4TOTAL, FREET4, T3FREE, THYROIDAB in the last 72 hours. Anemia Panel: No results for input(s): VITAMINB12, FOLATE, FERRITIN, TIBC, IRON, RETICCTPCT in the last 72 hours. Urine analysis:    Component Value Date/Time   COLORURINE YELLOW 12/31/2019 0350   APPEARANCEUR CLEAR 12/31/2019 0350   LABSPEC 1.024 12/31/2019 0350   PHURINE 6.0 12/31/2019 0350   GLUCOSEU NEGATIVE 12/31/2019 0350   HGBUR NEGATIVE 12/31/2019 0350   BILIRUBINUR NEGATIVE 12/31/2019 0350    KETONESUR NEGATIVE 12/31/2019 0350   PROTEINUR 30 (A) 12/31/2019 0350   NITRITE NEGATIVE 12/31/2019 0350   LEUKOCYTESUR NEGATIVE 12/31/2019 0350    Radiological Exams on Admission: DG Chest Port 1 View  Result Date: 12/31/2019 CLINICAL DATA:  Shortness of breath, cough and fever. EXAM: PORTABLE CHEST 1 VIEW COMPARISON:  None. FINDINGS: Mild hazy infiltrates are seen involving the bilateral lung bases and mid left lung. There is no evidence of a pleural effusion or pneumothorax. The cardiac silhouette is mildly enlarged. The visualized skeletal structures are unremarkable. IMPRESSION: Mild hazy bilateral infiltrates. Electronically Signed   By: Aram Candela M.D.   On: 12/31/2019 02:16    EKG: Pending.  Assessment/Plan Active Problems:   Acute respiratory disease due to COVID-19 virus   Hypertriglyceridemia   Gout   Pneumonia due to COVID-19 virus  #Covid pneumonia with acute hypoxic respiratory failure requiring 3 L nasal cannula -Patient qualifies for Decadron and remdesivir. -Follow-up LDH, CRP, ferritin, fibrinogen and D-dimer to assess need for tocilizumab. -Ceftriaxone azithromycin. -Symptomatic care with DuoNebs, Tylenol and antitussives.  #Hypertriglyceridemia  #Gout  DVT prophylaxis: Lovenox  code Status: Full code Family Communication: None.    Disposition Plan: Home.    Consults called: None. Admission status: Inpatient status going to stepdown.   Foye Damron MD [facility]   If 7PM-7AM, please contact night-coverage [email or web address] Password [facility]  12/31/2019, 6:02 AM

## 2020-01-01 ENCOUNTER — Inpatient Hospital Stay (HOSPITAL_COMMUNITY): Payer: 59

## 2020-01-01 ENCOUNTER — Encounter (HOSPITAL_COMMUNITY): Payer: Self-pay | Admitting: Internal Medicine

## 2020-01-01 DIAGNOSIS — R7989 Other specified abnormal findings of blood chemistry: Secondary | ICD-10-CM

## 2020-01-01 LAB — COMPREHENSIVE METABOLIC PANEL
ALT: 33 U/L (ref 0–44)
AST: 32 U/L (ref 15–41)
Albumin: 2.9 g/dL — ABNORMAL LOW (ref 3.5–5.0)
Alkaline Phosphatase: 34 U/L — ABNORMAL LOW (ref 38–126)
Anion gap: 11 (ref 5–15)
BUN: 19 mg/dL (ref 6–20)
CO2: 22 mmol/L (ref 22–32)
Calcium: 8.6 mg/dL — ABNORMAL LOW (ref 8.9–10.3)
Chloride: 102 mmol/L (ref 98–111)
Creatinine, Ser: 0.92 mg/dL (ref 0.61–1.24)
GFR calc Af Amer: >60 mL/min (ref >60)
GFR calc non Af Amer: >60 mL/min (ref >60)
Glucose, Bld: 135 mg/dL — ABNORMAL HIGH (ref 70–99)
Potassium: 4.4 mmol/L (ref 3.5–5.1)
Sodium: 135 mmol/L (ref 135–145)
Total Bilirubin: 1 mg/dL (ref 0.3–1.2)
Total Protein: 6.5 g/dL (ref 6.5–8.1)

## 2020-01-01 LAB — CBC
HCT: 46.2 % (ref 39.0–52.0)
Hemoglobin: 15.9 g/dL (ref 13.0–17.0)
MCH: 32.1 pg (ref 26.0–34.0)
MCHC: 34.4 g/dL (ref 30.0–36.0)
MCV: 93.3 fL (ref 80.0–100.0)
Platelets: 186 10*3/uL (ref 150–400)
RBC: 4.95 MIL/uL (ref 4.22–5.81)
RDW: 13.5 % (ref 11.5–15.5)
WBC: 15.6 10*3/uL — ABNORMAL HIGH (ref 4.0–10.5)
nRBC: 0 % (ref 0.0–0.2)

## 2020-01-01 LAB — C-REACTIVE PROTEIN: CRP: 15.6 mg/dL — ABNORMAL HIGH (ref <1.0)

## 2020-01-01 LAB — D-DIMER, QUANTITATIVE: D-Dimer, Quant: 1.11 ug/mL-FEU — ABNORMAL HIGH (ref 0.00–0.50)

## 2020-01-01 LAB — FERRITIN: Ferritin: 1213 ng/mL — ABNORMAL HIGH (ref 24–336)

## 2020-01-01 MED ORDER — IOHEXOL 350 MG/ML SOLN
100.0000 mL | Freq: Once | INTRAVENOUS | Status: AC | PRN
  Administered 2020-01-01: 100 mL via INTRAVENOUS

## 2020-01-01 MED ORDER — HYDROCOD POLST-CPM POLST ER 10-8 MG/5ML PO SUER: 5.0000 mL | Freq: Two times a day (BID) | ORAL | Status: DC | PRN

## 2020-01-01 NOTE — Progress Notes (Signed)
PROGRESS NOTE    Joshua [name]  MVH:[id number] DOB: [birth year] DOA: 12/31/2019 PCP: Orpah Melter, MD   Brief Narrative:  HPI: Joshua [name] is a 54 y.o. male with gout, hypertriglyceridemia and hypogonadism presented through EMS with above.   Symptoms started after visiting [facility] 12/22/2019 weekend with generalized fatigue, body aches followed by dry cough dyspnea and mild wheezing. He also reports fever more than 101 at home.  Breathing continued to worsen therefore wife called EMS.  He was noted to be hypoxic to 85% and was initially placed on nonrebreather mask and transition to 3 L nasal cannula.  Patient reports he is not vaccinated for Covid.  Personal social history: He is married with 2 children who he thinks are likely the source of his Covid.  Denies any history of smoking, drinking or recreational drug use.  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  Family history: Denies any history of CAD or cancers.   ED Course: 145/85, pulse 102-110, RR 36 SPO2 97% on 3 L nasal cannula T-max 99 F  CXR with mild hazy bilateral opacities Sodium 138, potassium 4.1, BUN 18, creatinine 1.15 WBC 7.6, Hb 16.9, platelets 142  Albumin 3.4, AST 49, ALP 37 Lactic acid 2.8--> 1.4 Covid PCR positive Blood cultures x2 pending  Assessment & Plan:   Active Problems:   Acute respiratory disease due to COVID-19 virus   Hypertriglyceridemia   Gout   Pneumonia due to COVID-19 virus   Lactic acidosis   SIRS (systemic inflammatory response syndrome) (HCC)   Acute hypoxic respiratory failure and severe sepsis secondary to COVID-19 pneumonia: Patient met criteria for severe sepsis based on tachycardia, tachypnea, lactic acid of 2.8 and hypoxia.  Patient feels better and now down to 4 L nasal cannula oxygen instead of 6 L that he was earlier. Chest x-ray shows mild hazy bilateral infiltrates.  Elevated inflammatory markers.  Continue Solu-Medrol, remdesivir per pharmacy  protocol, zinc, vitamin C. inflammatory markers improving.  He seems to be improving currently so we will hold off to the use of immunomodulators at this point in time.  We will add zinc and vitamin C. Actemra/Baricitinib  off label use - patient was told that if COVID-19 pneumonitis gets worse we might potentially use Actemra off label, patient denies any known history of active diverticulitis, tuberculosis or hepatitis, understands the risks and benefits and wants to proceed with Actemra treatment if required. Patient was encouraged to prone, out of bed to chair, to use incentive spirometry and flutter valve.  Hypertriglyceridemia: Continue home dose of fenofibrate.  DVT prophylaxis: enoxaparin (LOVENOX) injection 40 mg Start: 12/31/19 1200   Code Status: Full Code  Family Communication: None present at bedside.  Plan of care discussed with patient in length and he verbalized understanding and agreed with it.  Status is: Inpatient  Remains inpatient appropriate because:Hemodynamically unstable   Dispo: The patient is from: Home              Anticipated d/c is to: Home              Anticipated d/c date is: 3 days              Patient currently is not medically stable to d/c.        Estimated body mass index is 34.44 kg/m as calculated from the following:   Height as of this encounter: _0  (1.778 m).   Weight as of this encounter: 108.9 kg.  Nutritional status:               Consultants:   None  Procedures:   None  Antimicrobials:  Anti-infectives (From admission, onward)   Start     Dose/Rate Route Frequency Ordered Stop   01/01/20 1000  remdesivir 100 mg in sodium chloride 0.9 % 100 mL IVPB        100 mg 200 mL/hr over 30 Minutes Intravenous Daily 12/31/19 0359 01/05/20 0959   12/31/19 0630  cefTRIAXone (ROCEPHIN) 1 g in sodium chloride 0.9 % 100 mL IVPB  Status:  Discontinued        1 g 200 mL/hr over 30 Minutes Intravenous Every 24 hours  12/31/19 0625 12/31/19 0748   12/31/19 0630  azithromycin (ZITHROMAX) 500 mg in sodium chloride 0.9 % 250 mL IVPB  Status:  Discontinued        500 mg 250 mL/hr over 60 Minutes Intravenous Every 24 hours 12/31/19 0625 12/31/19 0748   12/31/19 0500  remdesivir 200 mg in sodium chloride 0.9% 250 mL IVPB        200 mg 580 mL/hr over 30 Minutes Intravenous Once 12/31/19 0359 12/31/19 0536         Subjective: Seen and examined.  Breathing has improved.  No complaint.  Objective: Vitals:   01/01/20 1015 01/01/20 1030 01/01/20 1045 01/01/20 1201  BP: (!) 128/91 139/81  (!) 151/94  Pulse: 84 84 90 83  Resp: (!) 36 (!) 40 (!) 29 (!) 24  Temp:    98.2 F (36.8 C)  TempSrc:    Oral  SpO2: (!) 86% (!) 88% 92% 93%  Weight:      Height:        Intake/Output Summary (Last 24 hours) at 01/01/2020 1322 Last data filed at 01/01/2020 1208 Gross per 24 hour  Intake 100 ml  Output 200 ml  Net -100 ml   Filed Weights   12/31/19 0137  Weight: 108.9 kg    Examination:  General exam: Appears calm and comfortable  Respiratory system: Clear to auscultation. Respiratory effort normal. Cardiovascular system: S1 & S2 heard, RRR. No JVD, murmurs, rubs, gallops or clicks. No pedal edema. Gastrointestinal system: Abdomen is nondistended, soft and nontender. No organomegaly or masses felt. Normal bowel sounds heard. Central nervous system: Alert and oriented. No focal neurological deficits. Extremities: Symmetric 5 x 5 power. Skin: No rashes, lesions or ulcers.  Psychiatry: Judgement and insight appear normal. Mood & affect appropriate.    Data Reviewed: I have personally reviewed following labs and imaging studies  CBC: Recent Labs  Lab 12/31/19 0141 01/01/20 0613  WBC 17.6* 15.6*  NEUTROABS 16.4*  --   HGB 16.9 15.9  HCT 48.3 46.2  MCV 91.8 93.3  PLT 142* 157   Basic Metabolic Panel: Recent Labs  Lab 12/31/19 0141 12/31/19 0539 01/01/20 0613  NA 138  --  135  K 4.1  --  4.4   CL 104  --  102  CO2 21*  --  22  GLUCOSE 136*  --  135*  BUN 18  --  19  CREATININE 1.15  --  0.92  CALCIUM 9.0  --  8.6*  MG  --  1.9  --    GFR: Estimated Creatinine Clearance: 113.5 mL/min (by C-G formula based on SCr of 0.92 mg/dL). Liver Function Tests: Recent Labs  Lab 12/31/19 0141 01/01/20 0613  AST 49* 32  ALT 44 33  ALKPHOS 37* 34*  BILITOT 0.7 1.0  PROT 6.9 6.5  ALBUMIN 3.4* 2.9*   No results for input(s): LIPASE, AMYLASE in the last 168 hours. No results for input(s): AMMONIA in the last 168 hours. Coagulation Profile: Recent Labs  Lab 12/31/19 0141  INR 1.0   Cardiac Enzymes: No results for input(s): CKTOTAL, CKMB, CKMBINDEX, TROPONINI in the last 168 hours. BNP (last 3 results) No results for input(s): PROBNP in the last 8760 hours. HbA1C: Recent Labs    12/31/19 0539  HGBA1C 5.2   CBG: No results for input(s): GLUCAP in the last 168 hours. Lipid Profile: Recent Labs    12/31/19 0539  TRIG 86   Thyroid Function Tests: No results for input(s): TSH, T4TOTAL, FREET4, T3FREE, THYROIDAB in the last 72 hours. Anemia Panel: Recent Labs    12/31/19 0539 01/01/20 0613  FERRITIN 899* 1,213*   Sepsis Labs: Recent Labs  Lab 12/31/19 0141 12/31/19 0350 12/31/19 0539  PROCALCITON  --   --  0.13  LATICACIDVEN 2.8* 1.4  --     Recent Results (from the past 240 hour(s))  Culture, blood (Routine x 2)     Status: None (Preliminary result)   Collection Time: 12/31/19  1:41 AM   Specimen: BLOOD  Result Value Ref Range Status   Specimen Description   Final    BLOOD RIGHT ANTECUBITAL Performed at [facility], [place] [address], [place], [place] [address]    Special Requests   Final    BOTTLES DRAWN AEROBIC AND ANAEROBIC Blood Culture adequate volume Performed at [facility] [address] [place], [place] [address]    Culture   Final    NO GROWTH 1 DAY Performed at [facility], [place] [address] [place], [place] [address]    Report Status PENDING  Incomplete  Culture, blood (Routine x 2)     Status: None (Preliminary result)   Collection Time: 12/31/19  1:41 AM   Specimen: BLOOD  Result Value Ref Range Status   Specimen Description   Final    BLOOD LEFT ANTECUBITAL Performed at [facility] [address], [place], [place] [address]    Special Requests   Final    BOTTLES DRAWN AEROBIC AND ANAEROBIC Blood Culture adequate volume Performed at [place] [address], [place], [place] [address]    Culture   Final    NO GROWTH 1 DAY Performed at [facility], [place] [address] [place], [place] [address]    Report Status PENDING  Incomplete  SARS Coronavirus 2 by RT PCR (hospital order, performed in [facility] hospital lab) Nasopharyngeal Nasopharyngeal Swab     Status: Abnormal   Collection Time: 12/31/19  1:41 AM   Specimen: Nasopharyngeal Swab  Result Value Ref Range Status   SARS Coronavirus 2 POSITIVE (A) NEGATIVE Final    Comment: RESULT CALLED TO, READ BACK BY AND VERIFIED WITH: S,DOSTER AT 0340 ON 12/31/19 BY A,MOHAMED (NOTE) SARS-CoV-2 target nucleic acids are DETECTED  SARS-CoV-2 RNA is generally detectable in upper respiratory specimens  during the acute phase of infection.  Positive results are indicative  of the presence of the identified virus, but do not rule out bacterial infection or co-infection with other pathogens not detected by the test.  Clinical correlation with patient history and  other diagnostic information is necessary to determine patient infection status.  The expected result is negative.  Fact Sheet for Patients:   StrictlyIdeas.no   Fact Sheet for Healthcare Providers:  BankingDealers.co.za    This test is not yet approved or cleared by the [place] and  has been authorized for detection and/or diagnosis of SARS-CoV-2 by FDA under an  Emergency Use Authorization (EUA).  This EUA will remain in effect (meaning t his test can be used) for the duration of  the COVID-19 declaration under Section 564(b)(1) of the Act, 21 U.S.C. section 360-bbb-3(b)(1), unless the authorization is terminated or revoked sooner.  Performed at [facility], [place] [address], [place], [place] [address]       Radiology Studies: CT ANGIO CHEST PE W OR WO CONTRAST  Result Date: 01/01/2020 CLINICAL DATA:  PE suspected, high probability EXAM: CT ANGIOGRAPHY CHEST WITH CONTRAST TECHNIQUE: Multidetector CT imaging of the chest was performed using the standard protocol during bolus administration of intravenous contrast. Multiplanar CT image reconstructions and MIPs were obtained to evaluate the vascular anatomy. CONTRAST:  139m OMNIPAQUE IOHEXOL 350 MG/ML SOLN COMPARISON:  Abdomen and pelvis CT from March of 2021 FINDINGS: Cardiovascular: Normal caliber thoracic aorta. Heart size is normal without pericardial effusion. Exam limited by respiratory motion and bolus timing. No central or lobar pulmonary embolism. Mediastinum/Nodes: Scattered small lymph nodes throughout the chest none displaying pathologic enlargement. Lungs/Pleura: Patchy areas of ground-glass opacity and septal thickening throughout the chest more pronounced at the lung bases and mid chest. No effusion. Upper Abdomen: Post cholecystectomy. No biliary duct distension. Liver is incompletely imaged. No acute upper abdominal process to the extent evaluated. Musculoskeletal: No acute musculoskeletal finding. Spinal degenerative changes. Review of the MIP images confirms the above findings. IMPRESSION: 1. Exam limited by respiratory motion and bolus timing. No central or lobar pulmonary embolism. 2. Patchy areas of ground-glass opacity and septal thickening throughout the chest more pronounced at the lung bases and mid chest. Findings of multifocal pneumonia in the setting of COVID-19  infection. Electronically Signed   By: GZetta BillsM.D.   On: 01/01/2020 11:28   DG Chest Port 1 View  Result Date: 12/31/2019 CLINICAL DATA:  Shortness of breath, cough and fever. EXAM: PORTABLE CHEST 1 VIEW COMPARISON:  None. FINDINGS: Mild hazy infiltrates are seen involving the bilateral lung bases and mid left lung. There is no evidence of a pleural effusion or pneumothorax. The cardiac silhouette is mildly enlarged. The visualized skeletal structures are unremarkable. IMPRESSION: Mild hazy bilateral infiltrates. Electronically Signed   By: TVirgina NorfolkM.D.   On: 12/31/2019 02:16   VAS [place]OWER EXTREMITY VENOUS (DVT)  Result Date: 01/01/2020  Lower Venous DVTStudy Indications: Elevated d-dimer.  Comparison Study: No prior studies. Performing Technologist: RDarlin Coco Examination Guidelines: A complete evaluation includes B-mode imaging, spectral Doppler, color Doppler, and power Doppler as needed of all accessible portions of each vessel. Bilateral testing is considered an integral part of a complete examination. Limited examinations for reoccurring indications may be performed as noted. The reflux portion of the exam is performed with the patient in reverse Trendelenburg.  +---------+---------------+---------+-----------+----------+--------------+ RIGHT    CompressibilityPhasicitySpontaneityPropertiesThrombus Aging +---------+---------------+---------+-----------+----------+--------------+ CFV      Full           Yes      Yes                                 +---------+---------------+---------+-----------+----------+--------------+ SFJ      Full                                                        +---------+---------------+---------+-----------+----------+--------------+  FV Prox  Full                                                        +---------+---------------+---------+-----------+----------+--------------+ FV Mid   Full                                                         +---------+---------------+---------+-----------+----------+--------------+ FV DistalFull                                                        +---------+---------------+---------+-----------+----------+--------------+ PFV      Full                                                        +---------+---------------+---------+-----------+----------+--------------+ POP      Full           Yes      Yes                                 +---------+---------------+---------+-----------+----------+--------------+ PTV      Full                                                        +---------+---------------+---------+-----------+----------+--------------+ PERO     Full                                                        +---------+---------------+---------+-----------+----------+--------------+   +---------+---------------+---------+-----------+----------+--------------+ LEFT     CompressibilityPhasicitySpontaneityPropertiesThrombus Aging +---------+---------------+---------+-----------+----------+--------------+ CFV      Full           Yes      Yes                                 +---------+---------------+---------+-----------+----------+--------------+ SFJ      Full                                                        +---------+---------------+---------+-----------+----------+--------------+ FV Prox  Full                                                        +---------+---------------+---------+-----------+----------+--------------+  FV Mid   Full                                                        +---------+---------------+---------+-----------+----------+--------------+ FV DistalFull                                                        +---------+---------------+---------+-----------+----------+--------------+ PFV      Full                                                         +---------+---------------+---------+-----------+----------+--------------+ POP      Full           Yes      Yes                                 +---------+---------------+---------+-----------+----------+--------------+ PTV      Full                                                        +---------+---------------+---------+-----------+----------+--------------+ PERO     Full                                                        +---------+---------------+---------+-----------+----------+--------------+     Summary: RIGHT: - There is no evidence of deep vein thrombosis in the lower extremity.  - No cystic structure found in the popliteal fossa.  LEFT: - There is no evidence of deep vein thrombosis in the lower extremity.  - No cystic structure found in the popliteal fossa.  *See table(s) above for measurements and observations.    Preliminary     Scheduled Meds: . albuterol  2 puff Inhalation Q6H  . allopurinol  50 mg Oral Daily  . vitamin C  500 mg Oral Daily  . benzonatate  100 mg Oral TID  . enoxaparin (LOVENOX) injection  40 mg Subcutaneous Daily  . fenofibrate  54 mg Oral Daily  . guaiFENesin  600 mg Oral BID  . methylPREDNISolone (SOLU-MEDROL) injection  0.5 mg/kg Intravenous BID  . traZODone  150 mg Oral Daily  . zinc sulfate  220 mg Oral Daily   Continuous Infusions: . sodium chloride 75 mL/hr at 01/01/20 0856  . remdesivir 100 mg in NS 100 mL Stopped (01/01/20 1203)     LOS: 1 day   Time spent: 30 minutes  Darliss Cheney, MD [facility]  01/01/2020, 1:22 PM   To contact the attending provider between 7A-7P or the covering provider during after hours 7P-7A, please log into the web site [email or web address].

## 2020-01-01 NOTE — ED Notes (Signed)
Pt in CT at present. Awaiting admission on floor. Per RN report pt on 4 lpm [place]. Will assess pt with return from CT.

## 2020-01-01 NOTE — ED Notes (Signed)
Pt's O2 turned down to 3L.  Pt is maintaining O2 at 94%.

## 2020-01-01 NOTE — ED Notes (Signed)
Patient transported to CT 

## 2020-01-01 NOTE — ED Notes (Signed)
Pt transitioned to prone position and tolerating well. Call light within reach. Will continue to monitor.

## 2020-01-01 NOTE — ED Notes (Signed)
Pt got increasingly SOB w/ using the bedside commode.  O2 level at 87% w/ 3L [place]. O2 increased to 4L.

## 2020-01-01 NOTE — Progress Notes (Signed)
Lower extremity venous bilateral study completed.   Please see CV Proc for preliminary results.   Joshua [name]  

## 2020-01-02 ENCOUNTER — Other Ambulatory Visit: Payer: Self-pay

## 2020-01-02 DIAGNOSIS — U071 COVID-19: Secondary | ICD-10-CM

## 2020-01-02 DIAGNOSIS — J069 Acute upper respiratory infection, unspecified: Secondary | ICD-10-CM

## 2020-01-02 LAB — FERRITIN: Ferritin: 1205 ng/mL — ABNORMAL HIGH (ref 24–336)

## 2020-01-02 LAB — CBC
HCT: 48.4 % (ref 39.0–52.0)
Hemoglobin: 16.6 g/dL (ref 13.0–17.0)
MCH: 32 pg (ref 26.0–34.0)
MCHC: 34.3 g/dL (ref 30.0–36.0)
MCV: 93.4 fL (ref 80.0–100.0)
Platelets: 202 10*3/uL (ref 150–400)
RBC: 5.18 MIL/uL (ref 4.22–5.81)
RDW: 13.4 % (ref 11.5–15.5)
WBC: 17.7 10*3/uL — ABNORMAL HIGH (ref 4.0–10.5)
nRBC: 0 % (ref 0.0–0.2)

## 2020-01-02 LAB — COMPREHENSIVE METABOLIC PANEL
ALT: 58 U/L — ABNORMAL HIGH (ref 0–44)
AST: 42 U/L — ABNORMAL HIGH (ref 15–41)
Albumin: 2.9 g/dL — ABNORMAL LOW (ref 3.5–5.0)
Alkaline Phosphatase: 36 U/L — ABNORMAL LOW (ref 38–126)
Anion gap: 7 (ref 5–15)
BUN: 23 mg/dL — ABNORMAL HIGH (ref 6–20)
CO2: 21 mmol/L — ABNORMAL LOW (ref 22–32)
Calcium: 8.6 mg/dL — ABNORMAL LOW (ref 8.9–10.3)
Chloride: 108 mmol/L (ref 98–111)
Creatinine, Ser: 0.9 mg/dL (ref 0.61–1.24)
GFR calc Af Amer: >60 mL/min (ref >60)
GFR calc non Af Amer: >60 mL/min (ref >60)
Glucose, Bld: 142 mg/dL — ABNORMAL HIGH (ref 70–99)
Potassium: 4.6 mmol/L (ref 3.5–5.1)
Sodium: 136 mmol/L (ref 135–145)
Total Bilirubin: 0.8 mg/dL (ref 0.3–1.2)
Total Protein: 6.3 g/dL — ABNORMAL LOW (ref 6.5–8.1)

## 2020-01-02 LAB — GLUCOSE, CAPILLARY
Glucose-Capillary: 168 mg/dL — ABNORMAL HIGH (ref 70–99)
Glucose-Capillary: 160 mg/dL — ABNORMAL HIGH (ref 70–99)

## 2020-01-02 LAB — C-REACTIVE PROTEIN: CRP: 7.6 mg/dL — ABNORMAL HIGH (ref <1.0)

## 2020-01-02 LAB — D-DIMER, QUANTITATIVE: D-Dimer, Quant: 0.76 ug/mL-FEU — ABNORMAL HIGH (ref 0.00–0.50)

## 2020-01-02 MED ORDER — INSULIN ASPART 100 UNIT/ML ~~LOC~~ SOLN
0.0000 [IU] | Freq: Three times a day (TID) | SUBCUTANEOUS | Status: DC
  Administered 2020-01-03 – 2020-01-05 (×5): 1 [IU] via SUBCUTANEOUS

## 2020-01-02 MED ORDER — FUROSEMIDE 10 MG/ML IJ SOLN
20.0000 mg | Freq: Two times a day (BID) | INTRAMUSCULAR | Status: AC
  Administered 2020-01-02 – 2020-01-03 (×2): 20 mg via INTRAVENOUS
  Filled 2020-01-02 (×2): qty 2

## 2020-01-02 MED ORDER — VITAMIN D 25 MCG (1000 UNIT) PO TABS
1000.0000 [IU] | ORAL_TABLET | Freq: Every day | ORAL | Status: DC
  Administered 2020-01-02 – 2020-01-05 (×4): 1000 [IU] via ORAL
  Filled 2020-01-02 (×4): qty 1

## 2020-01-02 NOTE — Progress Notes (Signed)
PROGRESS NOTE  Joshua [name] OAC:[id number] DOB: 1966/01/14 DOA: 12/31/2019 PCP: Joycelyn Rua, MD  HPI/Recap of past 24 hours: Joshua [name] a 54 y.o.malewithgout, hypertriglyceridemia and hypogonadism presented after visiting [facility] on 12/22/2019 weekend through EMS with generalized weakness, body aches, and nonproductive cough, mild wheezing and fever 101 at home.  ED Course:145/85, pulse 102-110, RR 36 SPO2 97% on 3 L nasal cannula T-max 99 F  CXR with mild hazy bilateral opacities Sodium 138, potassium 4.1, BUN 18, creatinine 1.15 WBC 7.6, Hb 16.9, platelets 142  Albumin 3.4, AST 49, ALP 37 Lactic acid 2.8-->1.4 Covid PCR positive on 12/31/19. Blood cultures x2 negative to date.  01/02/20: Seen and examined.  Reports persistent nonproductive cough.  Denies any chest pain.  Not on oxygen supplementation at baseline.  Assessment/Plan: Active Problems:   Acute respiratory disease due to COVID-19 virus   Hypertriglyceridemia   Gout   Pneumonia due to COVID-19 virus   Lactic acidosis   SIRS (systemic inflammatory response syndrome) (HCC)  COVID-19 viral pneumonia COVID-19 screening test positive on 12/31/2019. Currently on IV Solu-Medrol 35 mg twice daily and remdesivir day #3 Continue incentive spirometer and flutter valve Pronate as tolerated Maintain O2 saturation greater than 90% Continue vitamin C, and zinc, add vitamin D3. Continue bronchodilators Continue to trend inflammatory markers  Acute hypoxic respiratory failure secondary to COVID-19 viral pneumonia Management as stated above Independently reviewed chest x-ray which shows bilateral pulmonary infiltrates consistent with COVID-19 viral pneumonia. Rest of management as stated above. We will add on BNP We will give 2 doses of IV Lasix 20 mg x 2.  Hyperglycemia, likely exacerbated by IV steroids Hemoglobin A1c 5.2 Start insulin sliding scale while on IV steroids  Obesity BMI 34 Recommend  weight loss outpatient with regular physical activity and healthy dieting.  Code Status: Full code  Family Communication: None at bedside  Disposition Plan:    Consultants:  None  Procedures:  None  Antimicrobials:  None  DVT prophylaxis: Subcu Lovenox daily  Status is: Inpatient    Dispo: The patient is from: Home.               Anticipated d/c is to: Home.               Anticipated d/c date is: 01/04/2020              Patient currently not stable for discharge due to ongoing management of Covid-19 viral PNA         Objective: Vitals:   01/02/20 1030 01/02/20 1100 01/02/20 1130 01/02/20 1222  BP: (!) 142/75 (!) 139/93 133/73 135/78  Pulse: 75 79 78 77  Resp: (!) 29 (!) 31 (!) 35 18  Temp:    98.1 F (36.7 C)  TempSrc:      SpO2: 96% 91% 96% 95%  Weight:      Height:        Intake/Output Summary (Last 24 hours) at 01/02/2020 1417 Last data filed at 01/01/2020 2309 Gross per 24 hour  Intake 1000 ml  Output --  Net 1000 ml   Filed Weights   12/31/19 0137  Weight: 108.9 kg    Exam:  . General: 54 y.o. year-old male well developed well nourished in no acute distress.  Alert and oriented x3. . Cardiovascular: Regular rate and rhythm with no rubs or gallops.  No thyromegaly or JVD noted.   Marland Kitchen Respiratory: Mild rales at bases no wheezing noted.  Poor inspiratory effort.   Marland Kitchen  Abdomen: Soft, obese, nontender nondistended with normal bowel sounds. . Musculoskeletal: Trace lower extremity edema bilaterally. Marland Kitchen Psychiatry: Mood is appropriate for condition and setting   Data Reviewed: CBC: Recent Labs  Lab 12/31/19 0141 01/01/20 0613 01/02/20 0300  WBC 17.6* 15.6* 17.7*  NEUTROABS 16.4*  --   --   HGB 16.9 15.9 16.6  HCT 48.3 46.2 48.4  MCV 91.8 93.3 93.4  PLT 142* 186 202   Basic Metabolic Panel: Recent Labs  Lab 12/31/19 0141 12/31/19 0539 01/01/20 0613 01/02/20 0300  NA 138  --  135 136  K 4.1  --  4.4 4.6  CL 104  --  102 108  CO2  21*  --  22 21*  GLUCOSE 136*  --  135* 142*  BUN 18  --  19 23*  CREATININE 1.15  --  0.92 0.90  CALCIUM 9.0  --  8.6* 8.6*  MG  --  1.9  --   --    GFR: Estimated Creatinine Clearance: 116 mL/min (by C-G formula based on SCr of 0.9 mg/dL). Liver Function Tests: Recent Labs  Lab 12/31/19 0141 01/01/20 0613 01/02/20 0300  AST 49* 32 42*  ALT 44 33 58*  ALKPHOS 37* 34* 36*  BILITOT 0.7 1.0 0.8  PROT 6.9 6.5 6.3*  ALBUMIN 3.4* 2.9* 2.9*   No results for input(s): LIPASE, AMYLASE in the last 168 hours. No results for input(s): AMMONIA in the last 168 hours. Coagulation Profile: Recent Labs  Lab 12/31/19 0141  INR 1.0   Cardiac Enzymes: No results for input(s): CKTOTAL, CKMB, CKMBINDEX, TROPONINI in the last 168 hours. BNP (last 3 results) No results for input(s): PROBNP in the last 8760 hours. HbA1C: Recent Labs    12/31/19 0539  HGBA1C 5.2   CBG: No results for input(s): GLUCAP in the last 168 hours. Lipid Profile: Recent Labs    12/31/19 0539  TRIG 86   Thyroid Function Tests: No results for input(s): TSH, T4TOTAL, FREET4, T3FREE, THYROIDAB in the last 72 hours. Anemia Panel: Recent Labs    01/01/20 0613 01/02/20 0300  FERRITIN 1,213* 1,205*   Urine analysis:    Component Value Date/Time   COLORURINE YELLOW 12/31/2019 0350   APPEARANCEUR CLEAR 12/31/2019 0350   LABSPEC 1.024 12/31/2019 0350   PHURINE 6.0 12/31/2019 0350   GLUCOSEU NEGATIVE 12/31/2019 0350   HGBUR NEGATIVE 12/31/2019 0350   BILIRUBINUR NEGATIVE 12/31/2019 0350   KETONESUR NEGATIVE 12/31/2019 0350   PROTEINUR 30 (A) 12/31/2019 0350   NITRITE NEGATIVE 12/31/2019 0350   LEUKOCYTESUR NEGATIVE 12/31/2019 0350   Sepsis Labs: @LABRCNTIP (procalcitonin:4,lacticidven:4)  ) Recent Results (from the past 240 hour(s))  Culture, blood (Routine x 2)     Status: None (Preliminary result)   Collection Time: 12/31/19  1:41 AM   Specimen: BLOOD  Result Value Ref Range Status   Specimen  Description   Final    BLOOD RIGHT ANTECUBITAL Performed at [facility], [address] [place], [place]    Special Requests   Final    BOTTLES DRAWN AEROBIC AND ANAEROBIC Blood Culture adequate volume Performed at [facility], [address], [place], [place]    Culture   Final    NO GROWTH 2 DAYS Performed at [facility], [address], [place], [place]    Report Status PENDING  Incomplete  Culture, blood (Routine x 2)     Status: None (Preliminary result)   Collection Time: 12/31/19  1:41 AM   Specimen: BLOOD  Result Value Ref Range Status   Specimen Description   Final    BLOOD LEFT ANTECUBITAL Performed at [facility], [address], [place], [place] [address]    Special Requests   Final    BOTTLES DRAWN AEROBIC AND ANAEROBIC Blood Culture adequate volume Performed at [facility], [address], [place], [place] [address]    Culture   Final    NO GROWTH 2 DAYS Performed at [facility], [address], [place], [place] [address]    Report Status PENDING  Incomplete  SARS Coronavirus 2 by RT PCR (hospital order, performed in [facility] hospital lab) Nasopharyngeal Nasopharyngeal Swab     Status: Abnormal   Collection Time: 12/31/19  1:41 AM   Specimen: Nasopharyngeal Swab  Result Value Ref Range Status   SARS Coronavirus 2 POSITIVE (A) NEGATIVE Final    Comment: RESULT CALLED TO, READ BACK BY AND VERIFIED WITH: S,DOSTER AT 0340 ON 12/31/19 BY A,MOHAMED (NOTE) SARS-CoV-2 target nucleic acids are DETECTED  SARS-CoV-2 RNA is generally detectable in upper respiratory specimens  during the acute phase of infection.  Positive results are indicative  of the presence of the identified virus, but do not rule out bacterial infection or co-infection with other pathogens not detected by the test.  Clinical correlation with patient history and  other  diagnostic information is necessary to determine patient infection status.  The expected result is negative.  Fact Sheet for Patients:   BoilerBrush.com.cy   Fact Sheet for Healthcare Providers:   https://pope.com/    This test is not yet approved or cleared by the [place] FDA and  has been authorized for detection and/or diagnosis of SARS-CoV-2 by FDA under an Emergency Use Authorization (EUA).  This EUA will remain in effect (meaning t his test can be used) for the duration of  the COVID-19 declaration under Section 564(b)(1) of the Act, 21 U.S.C. section 360-bbb-3(b)(1), unless the authorization is terminated or revoked sooner.  Performed at [facility], [address], [place], [place] [address]       Studies: No results found.  Scheduled Meds: . albuterol  2 puff Inhalation Q6H  . allopurinol  50 mg Oral Daily  . vitamin C  500 mg Oral Daily  . benzonatate  100 mg Oral TID  . enoxaparin (LOVENOX) injection  40 mg Subcutaneous Daily  . fenofibrate  54 mg Oral Daily  . guaiFENesin  600 mg Oral BID  . methylPREDNISolone (SOLU-MEDROL) injection  0.5 mg/kg Intravenous BID  . traZODone  150 mg Oral Daily  . zinc sulfate  220 mg Oral Daily    Continuous Infusions: . sodium chloride Stopped (01/01/20 2309)  . remdesivir 100 mg in NS 100 mL 100 mg (01/02/20 0938)     LOS: 2 days     Darlin Drop, MD [facility] Pager [phone]  If 7PM-7AM, please contact night-coverage [email or web address] Password [facility] 01/02/2020, 2:17 PM

## 2020-01-02 NOTE — ED Notes (Signed)
ED TO INPATIENT HANDOFF REPORT  ED Nurse Name and Phone #: Joice Loftsmber [phone]  S Name/Age/Gender Joshua [name] 54 y.o. male Room/Bed: WA01/WA01  Code Status   Code Status: Full Code  Home/SNF/Other Home Patient oriented to: self Is this baseline? Yes   Triage Complete: Triage complete  Chief Complaint Pneumonia due to COVID-19 virus [U07.1, J12.82]  Triage Note Pt BIB EMS c/o COVID-like sx (cough, fever, hypoxia, shob). Pt was tested for COVID on 9/10, but still waiting for results. Temp 101.5. 1000 mg tylenol given by EMS. LS clear bilaterally. 85% RA and now 94% NRB 10L. A&O, ambulatory, independent.   120 HR 146/88    Allergies Allergies  Allergen Reactions  . Penicillins     Has taken Penicillins as an adult     Level of Care/Admitting Diagnosis ED Disposition    ED Disposition Condition Comment   Admit  [facility] [[id number]  Level of Care: Med-Surg [16]  [facility] or [facility] if equivalent level of care is available:: No  Covid Evaluation: Confirmed COVID Positive  Diagnosis: Pneumonia due to COVID-19 virus [[id number]][[phone]]  Admitting Physician: Hughie ClossPAHWANI, RAVI [[id number]]  Attending Physician: Hughie ClossPAHWANI, RAVI [phone][[id number]]  Estimated length of stay: 3 - 4 days  Certification:: I certify this patient will need inpatient services for at least 2 midnights       B Medical/Surgery History Past Medical History:  Diagnosis Date  . Gout   . Gout   . Low testosterone    Past Surgical History:  Procedure Laterality Date  . APPENDECTOMY    . CHOLECYSTECTOMY    . INCISION AND DRAINAGE PERIRECTAL ABSCESS N/A 06/28/2019   Procedure: IRRIGATION AND DEBRIDEMENT PERIRECTAL ABSCESS;  Surgeon: Karie SodaGross, Steven, MD;  Location: [facility];  Service: General;  Laterality: N/A;     A IV Location/Drains/Wounds Patient Lines/Drains/Airways Status    Active Line/Drains/Airways    Name Placement date Placement time Site Days    Peripheral IV 12/31/19 Left Antecubital 12/31/19  0132  Antecubital  2   Peripheral IV 12/31/19 Right Hand 12/31/19  0138  Hand  2   Incision (Closed) 06/28/19 Rectum Other (Comment) 06/28/19  1836   188          Intake/Output Last 24 hours  Intake/Output Summary (Last 24 hours) at 01/02/2020 1124 Last data filed at 01/01/2020 2309 Gross per 24 hour  Intake 1100 ml  Output 200 ml  Net 900 ml    Labs/Imaging Results for orders placed or performed during the hospital encounter of 12/31/19 (from the past 48 hour(s))  Comprehensive metabolic panel     Status: Abnormal   Collection Time: 01/01/20  6:13 AM  Result Value Ref Range   Sodium 135 135 - 145 mmol/L   Potassium 4.4 3.5 - 5.1 mmol/L   Chloride 102 98 - 111 mmol/L   CO2 22 22 - 32 mmol/L   Glucose, Bld 135 (H) 70 - 99 mg/dL    Comment: Glucose reference range applies only to samples taken after fasting for at least 8 hours.   BUN 19 6 - 20 mg/dL   Creatinine, Ser 8.410.92 0.61 - 1.24 mg/dL   Calcium 8.6 (L) 8.9 - 10.3 mg/dL   Total Protein 6.5 6.5 - 8.1 g/dL   Albumin 2.9 (L) 3.5 - 5.0 g/dL   AST 32 15 - 41 U/L   ALT 33 0 - 44 U/L   Alkaline Phosphatase 34 (L) 38 - 126 U/L  Total Bilirubin 1.0 0.3 - 1.2 mg/dL   GFR calc non Af Amer >60 >60 mL/min   GFR calc Af Amer >60 >60 mL/min   Anion gap 11 5 - 15    Comment: Performed at [facility], [address], [place], [place] [address]  CBC     Status: Abnormal   Collection Time: 01/01/20  6:13 AM  Result Value Ref Range   WBC 15.6 (H) 4.0 - 10.5 K/uL   RBC 4.95 4.22 - 5.81 MIL/uL   Hemoglobin 15.9 13.0 - 17.0 g/dL   HCT 56.2 39 - 52 %   MCV 93.3 80.0 - 100.0 fL   MCH 32.1 26.0 - 34.0 pg   MCHC 34.4 30.0 - 36.0 g/dL   RDW 13.0 86.5 - 78.4 %   Platelets 186 150 - 400 K/uL   nRBC 0.0 0.0 - 0.2 %    Comment: Performed at [facility], [address] [place], [place] [address]  Ferritin     Status: Abnormal   Collection Time:  01/01/20  6:13 AM  Result Value Ref Range   Ferritin 1,213 (H) 24 - 336 ng/mL    Comment: Performed at [facility], [address], [place], [place] [address]  D-dimer, quantitative (not at [facility])     Status: Abnormal   Collection Time: 01/01/20  6:13 AM  Result Value Ref Range   D-Dimer, Quant 1.11 (H) 0.00 - 0.50 ug/mL-FEU    Comment: (NOTE) At the manufacturer cut-off of 0.50 ug/mL FEU, this assay has been documented to exclude PE with a sensitivity and negative predictive value of 97 to 99%.  At this time, this assay has not been approved by the FDA to exclude DVT/VTE. Results should be correlated with clinical presentation. Performed at [facility], [address], [place], [place] [address]   C-reactive protein     Status: Abnormal   Collection Time: 01/01/20  6:13 AM  Result Value Ref Range   CRP 15.6 (H) <1.0 mg/dL    Comment: Performed at [facility], [address], [place], [place] [address]  Comprehensive metabolic panel     Status: Abnormal   Collection Time: 01/02/20  3:00 AM  Result Value Ref Range   Sodium 136 135 - 145 mmol/L   Potassium 4.6 3.5 - 5.1 mmol/L   Chloride 108 98 - 111 mmol/L   CO2 21 (L) 22 - 32 mmol/L   Glucose, Bld 142 (H) 70 - 99 mg/dL    Comment: Glucose reference range applies only to samples taken after fasting for at least 8 hours.   BUN 23 (H) 6 - 20 mg/dL   Creatinine, Ser 5.36 0.61 - 1.24 mg/dL   Calcium 8.6 (L) 8.9 - 10.3 mg/dL   Total Protein 6.3 (L) 6.5 - 8.1 g/dL   Albumin 2.9 (L) 3.5 - 5.0 g/dL   AST 42 (H) 15 - 41 U/L   ALT 58 (H) 0 - 44 U/L   Alkaline Phosphatase 36 (L) 38 - 126 U/L   Total Bilirubin 0.8 0.3 - 1.2 mg/dL   GFR calc non Af Amer >60 >60 mL/min   GFR calc Af Amer >60 >60 mL/min   Anion gap 7 5 - 15    Comment: Performed at [facility], [address], [place], [place] [address]  CBC     Status: Abnormal   Collection Time: 01/02/20   3:00 AM  Result Value  Ref Range   WBC 17.7 (H) 4.0 - 10.5 K/uL   RBC 5.18 4.22 - 5.81 MIL/uL   Hemoglobin 16.6 13.0 - 17.0 g/dL   HCT 16.1 39 - 52 %   MCV 93.4 80.0 - 100.0 fL   MCH 32.0 26.0 - 34.0 pg   MCHC 34.3 30.0 - 36.0 g/dL   RDW 09.6 04.5 - 40.9 %   Platelets 202 150 - 400 K/uL   nRBC 0.0 0.0 - 0.2 %    Comment: Performed at [facility], [address], [place], [place] [address]  Ferritin     Status: Abnormal   Collection Time: 01/02/20  3:00 AM  Result Value Ref Range   Ferritin 1,205 (H) 24 - 336 ng/mL    Comment: Performed at [facility], [address] [place], [place] [address]  D-dimer, quantitative (not at [facility])     Status: Abnormal   Collection Time: 01/02/20  3:00 AM  Result Value Ref Range   D-Dimer, Quant 0.76 (H) 0.00 - 0.50 ug/mL-FEU    Comment: (NOTE) At the manufacturer cut-off of 0.50 ug/mL FEU, this assay has been documented to exclude PE with a sensitivity and negative predictive value of 97 to 99%.  At this time, this assay has not been approved by the FDA to exclude DVT/VTE. Results should be correlated with clinical presentation. Performed at [facility], [address], [place], [place] [address]   C-reactive protein     Status: Abnormal   Collection Time: 01/02/20  3:00 AM  Result Value Ref Range   CRP 7.6 (H) <1.0 mg/dL    Comment: Performed at [facility], [address] [place], [place] [address]   CT ANGIO CHEST PE W OR WO CONTRAST  Result Date: 01/01/2020 CLINICAL DATA:  PE suspected, high probability EXAM: CT ANGIOGRAPHY CHEST WITH CONTRAST TECHNIQUE: Multidetector CT imaging of the chest was performed using the standard protocol during bolus administration of intravenous contrast. Multiplanar CT image reconstructions and MIPs were obtained to evaluate the vascular anatomy. CONTRAST:  OMNIPAQUE IOHEXOL 350 MG/ML SOLN COMPARISON:  Abdomen and pelvis CT  from March of 2021 FINDINGS: Cardiovascular: Normal caliber thoracic aorta. Heart size is normal without pericardial effusion. Exam limited by respiratory motion and bolus timing. No central or lobar pulmonary embolism. Mediastinum/Nodes: Scattered small lymph nodes throughout the chest none displaying pathologic enlargement. Lungs/Pleura: Patchy areas of ground-glass opacity and septal thickening throughout the chest more pronounced at the lung bases and mid chest. No effusion. Upper Abdomen: Post cholecystectomy. No biliary duct distension. Liver is incompletely imaged. No acute upper abdominal process to the extent evaluated. Musculoskeletal: No acute musculoskeletal finding. Spinal degenerative changes. Review of the MIP images confirms the above findings. IMPRESSION: 1. Exam limited by respiratory motion and bolus timing. No central or lobar pulmonary embolism. 2. Patchy areas of ground-glass opacity and septal thickening throughout the chest more pronounced at the lung bases and mid chest. Findings of multifocal pneumonia in the setting of COVID-19 infection. Electronically Signed   By: Donzetta Kohut M.D.   On: 01/01/2020 11:28   VAS [place] LOWER EXTREMITY VENOUS (DVT)  Result Date: 01/01/2020  Lower Venous DVTStudy Indications: Elevated d-dimer.  Comparison Study: No prior studies. Performing Technologist: Jean Rosenthal  Examination Guidelines: A complete evaluation includes B-mode imaging, spectral Doppler, color Doppler, and power Doppler as needed of all accessible portions of each vessel. Bilateral testing is considered an integral part of a complete  examination. Limited examinations for reoccurring indications may be performed as noted. The reflux portion of the exam is performed with the patient in reverse Trendelenburg.  +---------+---------------+---------+-----------+----------+--------------+ RIGHT    CompressibilityPhasicitySpontaneityPropertiesThrombus Aging  +---------+---------------+---------+-----------+----------+--------------+ CFV      Full           Yes      Yes                                 +---------+---------------+---------+-----------+----------+--------------+ SFJ      Full                                                        +---------+---------------+---------+-----------+----------+--------------+ FV Prox  Full                                                        +---------+---------------+---------+-----------+----------+--------------+ FV Mid   Full                                                        +---------+---------------+---------+-----------+----------+--------------+ FV DistalFull                                                        +---------+---------------+---------+-----------+----------+--------------+ PFV      Full                                                        +---------+---------------+---------+-----------+----------+--------------+ POP      Full           Yes      Yes                                 +---------+---------------+---------+-----------+----------+--------------+ PTV      Full                                                        +---------+---------------+---------+-----------+----------+--------------+ PERO     Full                                                        +---------+---------------+---------+-----------+----------+--------------+   +---------+---------------+---------+-----------+----------+--------------+ LEFT     CompressibilityPhasicitySpontaneityPropertiesThrombus Aging +---------+---------------+---------+-----------+----------+--------------+ CFV      Full  Yes      Yes                                 +---------+---------------+---------+-----------+----------+--------------+ SFJ      Full                                                         +---------+---------------+---------+-----------+----------+--------------+ FV Prox  Full                                                        +---------+---------------+---------+-----------+----------+--------------+ FV Mid   Full                                                        +---------+---------------+---------+-----------+----------+--------------+ FV DistalFull                                                        +---------+---------------+---------+-----------+----------+--------------+ PFV      Full                                                        +---------+---------------+---------+-----------+----------+--------------+ POP      Full           Yes      Yes                                 +---------+---------------+---------+-----------+----------+--------------+ PTV      Full                                                        +---------+---------------+---------+-----------+----------+--------------+ PERO     Full                                                        +---------+---------------+---------+-----------+----------+--------------+     Summary: RIGHT: - There is no evidence of deep vein thrombosis in the lower extremity.  - No cystic structure found in the popliteal fossa.  LEFT: - There is no evidence of deep vein thrombosis in the lower extremity.  - No cystic structure found in the popliteal fossa.  *See table(s) above for measurements and observations. Electronically signed by Gretta Began MD on 01/01/2020 at 5:02:32  PM.    Final     Pending Labs Unresulted Labs (From admission, onward)          Start     Ordered   01/01/20 0500  Comprehensive metabolic panel  Daily,   R      12/31/19 0618   01/01/20 0500  CBC  Daily,   R      12/31/19 0618   01/01/20 0500  Ferritin  Daily,   R      12/31/19 1208   01/01/20 0500  D-dimer, quantitative (not at [facility])  Daily,   R      12/31/19 1208   01/01/20 0500  C-reactive  protein  Daily,   R      12/31/19 1208          Vitals/Pain Today's Vitals   01/02/20 1006 01/02/20 1029 01/02/20 1030 01/02/20 1100  BP:   (!) 142/75 (!) 139/93  Pulse:   75 79  Resp:   (!) 29 (!) 31  Temp:      TempSrc:      SpO2: 92%  96% 91%  Weight:      Height:      PainSc:  3       Isolation Precautions Airborne and Contact precautions  Medications Medications  remdesivir 100 mg in sodium chloride 0.9 % 100 mL IVPB (100 mg Intravenous New Bag/Given 01/02/20 0938)  0.45 % sodium chloride infusion ( Intravenous Stopped 01/01/20 2309)  allopurinol (ZYLOPRIM) tablet 50 mg (50 mg Oral Given 01/02/20 0935)  traZODone (DESYREL) tablet 150 mg (150 mg Oral Given 01/02/20 0933)  acetaminophen (TYLENOL) tablet 650 mg (650 mg Oral Given 01/02/20 0936)    Or  acetaminophen (TYLENOL) suppository 650 mg ( Rectal See Alternative 01/02/20 0936)  senna-docusate (Senokot-S) tablet 1 tablet (has no administration in time range)  ondansetron (ZOFRAN) tablet 4 mg (has no administration in time range)    Or  ondansetron (ZOFRAN) injection 4 mg (has no administration in time range)  guaiFENesin (MUCINEX) 12 hr tablet 600 mg (600 mg Oral Given 01/02/20 0935)  benzonatate (TESSALON) capsule 100 mg (100 mg Oral Given 01/02/20 0938)  enoxaparin (LOVENOX) injection 40 mg (40 mg Subcutaneous Given 01/02/20 0939)  methylPREDNISolone sodium succinate (SOLU-MEDROL) 125 mg/2 mL injection 54.375 mg (54.375 mg Intravenous Given 01/02/20 0938)  oxyCODONE (Oxy IR/ROXICODONE) immediate release tablet 5-10 mg (5 mg Oral Given 01/01/20 1645)  fenofibrate tablet 54 mg (54 mg Oral Given 01/02/20 0935)  ascorbic acid (VITAMIN C) tablet 500 mg (500 mg Oral Given 01/02/20 0935)  zinc sulfate capsule 220 mg (220 mg Oral Given 01/02/20 0935)  guaiFENesin-dextromethorphan (ROBITUSSIN DM) 100-10 MG/5ML syrup 10 mL (has no administration in time range)  albuterol (VENTOLIN HFA) 108 (90 Base) MCG/ACT inhaler 2 puff (2 puffs  Inhalation Given 01/02/20 0938)  chlorpheniramine-HYDROcodone (TUSSIONEX) 10-8 MG/5ML suspension 5 mL (has no administration in time range)  dexamethasone (DECADRON) injection 10 mg (10 mg Intravenous Given 12/31/19 0430)  remdesivir 200 mg in sodium chloride 0.9% 250 mL IVPB (0 mg Intravenous Stopped 12/31/19 0536)  lip balm (CARMEX) ointment ( Topical Given 12/31/19 0646)  iohexol (OMNIPAQUE) 350 MG/ML injection 100 mL (100 mLs Intravenous Contrast Given 01/01/20 1112)    Mobility walks Low fall risk   Focused Assessments Pulmonary Assessment Handoff:  Lung sounds:   O2 Device: Nasal Cannula O2 Flow Rate (L/min): 4 L/min      R Recommendations: See Admitting Provider Note  Report given to:   Additional  Notes:

## 2020-01-02 NOTE — ED Notes (Signed)
Report called to Catie, RN Patient will be transporting to 1524 via bed with RN and NA No questions from bedside RN at this time.

## 2020-01-03 LAB — COMPREHENSIVE METABOLIC PANEL
ALT: 114 U/L — ABNORMAL HIGH (ref 0–44)
AST: 53 U/L — ABNORMAL HIGH (ref 15–41)
Albumin: 2.9 g/dL — ABNORMAL LOW (ref 3.5–5.0)
Alkaline Phosphatase: 38 U/L (ref 38–126)
Anion gap: 9 (ref 5–15)
BUN: 25 mg/dL — ABNORMAL HIGH (ref 6–20)
CO2: 23 mmol/L (ref 22–32)
Calcium: 8.9 mg/dL (ref 8.9–10.3)
Chloride: 105 mmol/L (ref 98–111)
Creatinine, Ser: 0.99 mg/dL (ref 0.61–1.24)
GFR calc Af Amer: >60 mL/min (ref >60)
GFR calc non Af Amer: >60 mL/min (ref >60)
Glucose, Bld: 127 mg/dL — ABNORMAL HIGH (ref 70–99)
Potassium: 4.5 mmol/L (ref 3.5–5.1)
Sodium: 137 mmol/L (ref 135–145)
Total Bilirubin: 0.8 mg/dL (ref 0.3–1.2)
Total Protein: 6.2 g/dL — ABNORMAL LOW (ref 6.5–8.1)

## 2020-01-03 LAB — GLUCOSE, CAPILLARY
Glucose-Capillary: 121 mg/dL — ABNORMAL HIGH (ref 70–99)
Glucose-Capillary: 171 mg/dL — ABNORMAL HIGH (ref 70–99)
Glucose-Capillary: 190 mg/dL — ABNORMAL HIGH (ref 70–99)
Glucose-Capillary: 128 mg/dL — ABNORMAL HIGH (ref 70–99)

## 2020-01-03 LAB — D-DIMER, QUANTITATIVE: D-Dimer, Quant: 0.79 ug/mL-FEU — ABNORMAL HIGH (ref 0.00–0.50)

## 2020-01-03 LAB — CBC
HCT: 50.5 % (ref 39.0–52.0)
Hemoglobin: 16.7 g/dL (ref 13.0–17.0)
MCH: 31.5 pg (ref 26.0–34.0)
MCHC: 33.1 g/dL (ref 30.0–36.0)
MCV: 95.1 fL (ref 80.0–100.0)
Platelets: 234 10*3/uL (ref 150–400)
RBC: 5.31 MIL/uL (ref 4.22–5.81)
RDW: 13.3 % (ref 11.5–15.5)
WBC: 13.3 10*3/uL — ABNORMAL HIGH (ref 4.0–10.5)
nRBC: 0 % (ref 0.0–0.2)

## 2020-01-03 LAB — FERRITIN: Ferritin: 1039 ng/mL — ABNORMAL HIGH (ref 24–336)

## 2020-01-03 LAB — C-REACTIVE PROTEIN: CRP: 3 mg/dL — ABNORMAL HIGH (ref <1.0)

## 2020-01-03 MED ORDER — FUROSEMIDE 10 MG/ML IJ SOLN
20.0000 mg | Freq: Two times a day (BID) | INTRAMUSCULAR | Status: DC
  Administered 2020-01-03: 20 mg via INTRAVENOUS
  Filled 2020-01-03: qty 2

## 2020-01-03 MED ORDER — FUROSEMIDE 10 MG/ML IJ SOLN
20.0000 mg | Freq: Two times a day (BID) | INTRAMUSCULAR | Status: AC
  Administered 2020-01-03 – 2020-01-04 (×2): 20 mg via INTRAVENOUS
  Filled 2020-01-03 (×2): qty 2

## 2020-01-03 NOTE — Progress Notes (Signed)
SATURATION QUALIFICATIONS: (This note is used to comply with regulatory documentation for home oxygen)  Patient Saturations on Room Air at Rest = 89%  Patient Saturations on Room Air while Ambulating = 84%  Patient Saturations on 4 Liters of oxygen while Ambulating = 90%  Please briefly explain why patient needs home oxygen: patient needs 4L of oxygen while ambulating to maintain O2 saturations at a tolerable level.

## 2020-01-03 NOTE — Progress Notes (Signed)
PROGRESS NOTE  Keylen Eckenrode TDD:[id number] DOB: 20-Apr-1965 DOA: 12/31/2019 PCP: Joycelyn Rua, MD  HPI/Recap of past 24 hours: Lianne Bushy a 54 y.o.malewithgout, hypertriglyceridemia and hypogonadism presented after visiting [facility] on 12/22/2019 weekend through EMS with generalized weakness, body aches, and nonproductive cough, mild wheezing and fever 101 at home.  ED Course:145/85, pulse 102-110, RR 36 SPO2 97% on 3 L nasal cannula T-max 99 F  CXR with mild hazy bilateral opacities Sodium 138, potassium 4.1, BUN 18, creatinine 1.15 WBC 7.6, Hb 16.9, platelets 142  Albumin 3.4, AST 49, ALP 37 Lactic acid 2.8-->1.4 Covid PCR positive on 12/31/19. Blood cultures x2 negative to date.  01/03/20: Seen and examined.  Reports persistent dry cough and dyspnea with minimal exertion.  Currently on 6 L with O2 saturation 90%.  Assessment/Plan: Active Problems:   Acute respiratory disease due to COVID-19 virus   Hypertriglyceridemia   Gout   Pneumonia due to COVID-19 virus   Lactic acidosis   SIRS (systemic inflammatory response syndrome) (HCC)  COVID-19 viral pneumonia COVID-19 screening test positive on 12/31/2019. Currently on IV Solu-Medrol 35 mg twice daily and remdesivir day #4 Continue incentive spirometer and flutter valve Pronate as tolerated Maintain O2 saturation greater than 90% Continue vitamin C, and zinc, add vitamin D3. Continue bronchodilators Continue to trend inflammatory markers Inflammatory markers are downtrending  Acute hypoxic respiratory failure secondary to COVID-19 viral pneumonia Management as stated above Independently reviewed chest x-ray which shows bilateral pulmonary infiltrates consistent with COVID-19 viral pneumonia. Rest of management as stated above. We will give 2 doses of IV Lasix 20 mg x 2, repeat x2 doses. Net I&O +2.9 L, doubt accuracy, diurese to keep on the dry side. O2 saturation improving 95% on 4 L from 6  L.  Hyperglycemia, likely exacerbated by IV steroids Hemoglobin A1c 5.2 Continue insulin sliding scale while on IV steroids  Obesity BMI 34 Recommend weight loss outpatient with regular physical activity and healthy dieting.  Code Status: Full code  Family Communication: None at bedside  Disposition Plan:    Consultants:  None  Procedures:  None  Antimicrobials:  None  DVT prophylaxis: Subcu Lovenox daily  Status is: Inpatient    Dispo: The patient is from: Home.               Anticipated d/c is to: Home.               Anticipated d/c date is: 01/04/2020              Patient currently not stable for discharge due to ongoing management of Covid-19 viral PNA         Objective: Vitals:   01/02/20 2038 01/03/20 0046 01/03/20 0409 01/03/20 1337  BP: 130/74 133/77 127/70 (!) 154/79  Pulse: 75 (!) 57 (!) 59 (!) 58  Resp: (!) 22 (!) 25 (!) 25 16  Temp: 98.3 F (36.8 C) 97.9 F (36.6 C) 97.9 F (36.6 C) 98.4 F (36.9 C)  TempSrc:    Oral  SpO2: (!) 89% 93% 91% 95%  Weight:      Height:        Intake/Output Summary (Last 24 hours) at 01/03/2020 1638 Last data filed at 01/03/2020 1417 Gross per 24 hour  Intake 1660 ml  Output --  Net 1660 ml   Filed Weights   12/31/19 0137  Weight: 108.9 kg    Exam:  . General: 54 y.o. year-old male pleasant well-developed well-nourished no acute distress.  Alert  and oriented x3.   . Cardiovascular: Regular rate and rhythm no rubs or gallops.   Marland Kitchen Respiratory: Improved mild rales at bases no wheezing noted.  Poor inspiratory effort. . Abdomen: Soft obese nontender normal bowel sounds present.  . Musculoskeletal: Trace lower extremity edema bilaterally. Marland Kitchen Psychiatry: Mood is appropriate for condition and setting.   Data Reviewed: CBC: Recent Labs  Lab 12/31/19 0141 01/01/20 0613 01/02/20 0300 01/03/20 0355  WBC 17.6* 15.6* 17.7* 13.3*  NEUTROABS 16.4*  --   --   --   HGB 16.9 15.9 16.6 16.7  HCT 48.3  46.2 48.4 50.5  MCV 91.8 93.3 93.4 95.1  PLT 142* 186 202 234   Basic Metabolic Panel: Recent Labs  Lab 12/31/19 0141 12/31/19 0539 01/01/20 0613 01/02/20 0300 01/03/20 0355  NA 138  --  135 136 137  K 4.1  --  4.4 4.6 4.5  CL 104  --  102 108 105  CO2 21*  --  22 21* 23  GLUCOSE 136*  --  135* 142* 127*  BUN 18  --  19 23* 25*  CREATININE 1.15  --  0.92 0.90 0.99  CALCIUM 9.0  --  8.6* 8.6* 8.9  MG  --  1.9  --   --   --    GFR: Estimated Creatinine Clearance: 105.4 mL/min (by C-G formula based on SCr of 0.99 mg/dL). Liver Function Tests: Recent Labs  Lab 12/31/19 0141 01/01/20 0613 01/02/20 0300 01/03/20 0355  AST 49* 32 42* 53*  ALT 44 33 58* 114*  ALKPHOS 37* 34* 36* 38  BILITOT 0.7 1.0 0.8 0.8  PROT 6.9 6.5 6.3* 6.2*  ALBUMIN 3.4* 2.9* 2.9* 2.9*   No results for input(s): LIPASE, AMYLASE in the last 168 hours. No results for input(s): AMMONIA in the last 168 hours. Coagulation Profile: Recent Labs  Lab 12/31/19 0141  INR 1.0   Cardiac Enzymes: No results for input(s): CKTOTAL, CKMB, CKMBINDEX, TROPONINI in the last 168 hours. BNP (last 3 results) No results for input(s): PROBNP in the last 8760 hours. HbA1C: No results for input(s): HGBA1C in the last 72 hours. CBG: Recent Labs  Lab 01/02/20 1615 01/02/20 2059 01/03/20 0742 01/03/20 1124 01/03/20 1625  GLUCAP 168* 160* 121* 171* 190*   Lipid Profile: No results for input(s): CHOL, HDL, LDLCALC, TRIG, CHOLHDL, LDLDIRECT in the last 72 hours. Thyroid Function Tests: No results for input(s): TSH, T4TOTAL, FREET4, T3FREE, THYROIDAB in the last 72 hours. Anemia Panel: Recent Labs    01/02/20 0300 01/03/20 0355  FERRITIN 1,205* 1,039*   Urine analysis:    Component Value Date/Time   COLORURINE YELLOW 12/31/2019 0350   APPEARANCEUR CLEAR 12/31/2019 0350   LABSPEC 1.024 12/31/2019 0350   PHURINE 6.0 12/31/2019 0350   GLUCOSEU NEGATIVE 12/31/2019 0350   HGBUR NEGATIVE 12/31/2019 0350    BILIRUBINUR NEGATIVE 12/31/2019 0350   KETONESUR NEGATIVE 12/31/2019 0350   PROTEINUR 30 (A) 12/31/2019 0350   NITRITE NEGATIVE 12/31/2019 0350   LEUKOCYTESUR NEGATIVE 12/31/2019 0350   Sepsis Labs: @LABRCNTIP (procalcitonin:4,lacticidven:4)  ) Recent Results (from the past 240 hour(s))  Culture, blood (Routine x 2)     Status: None (Preliminary result)   Collection Time: 12/31/19  1:41 AM   Specimen: BLOOD  Result Value Ref Range Status   Specimen Description   Final    BLOOD RIGHT ANTECUBITAL Performed at [facility], [address], [place], [place]    Special Requests   Final  BOTTLES DRAWN AEROBIC AND ANAEROBIC Blood Culture adequate volume Performed at [facility], [address] [place], [place] [address]    Culture   Final    NO GROWTH 3 DAYS Performed at [facility], [address] [place], [place] [address]    Report Status PENDING  Incomplete  Culture, blood (Routine x 2)     Status: None (Preliminary result)   Collection Time: 12/31/19  1:41 AM   Specimen: BLOOD  Result Value Ref Range Status   Specimen Description   Final    BLOOD LEFT ANTECUBITAL Performed at [facility], [address] [place], [place] [address]    Special Requests   Final    BOTTLES DRAWN AEROBIC AND ANAEROBIC Blood Culture adequate volume Performed at [facility], [address], [place], [place] [address]    Culture   Final    NO GROWTH 3 DAYS Performed at [facility], [address], [place], [place] [address]    Report Status PENDING  Incomplete  SARS Coronavirus 2 by RT PCR (hospital order, performed in [facility] hospital lab) Nasopharyngeal Nasopharyngeal Swab     Status: Abnormal   Collection Time: 12/31/19  1:41 AM   Specimen: Nasopharyngeal Swab  Result Value Ref Range Status   SARS Coronavirus 2 POSITIVE (A) NEGATIVE Final    Comment: RESULT CALLED TO, READ BACK BY  AND VERIFIED WITH: S,DOSTER AT 0340 ON 12/31/19 BY A,MOHAMED (NOTE) SARS-CoV-2 target nucleic acids are DETECTED  SARS-CoV-2 RNA is generally detectable in upper respiratory specimens  during the acute phase of infection.  Positive results are indicative  of the presence of the identified virus, but do not rule out bacterial infection or co-infection with other pathogens not detected by the test.  Clinical correlation with patient history and  other diagnostic information is necessary to determine patient infection status.  The expected result is negative.  Fact Sheet for Patients:   BoilerBrush.com.cy   Fact Sheet for Healthcare Providers:   https://pope.com/    This test is not yet approved or cleared by the [place] FDA and  has been authorized for detection and/or diagnosis of SARS-CoV-2 by FDA under an Emergency Use Authorization (EUA).  This EUA will remain in effect (meaning t his test can be used) for the duration of  the COVID-19 declaration under Section 564(b)(1) of the Act, 21 U.S.C. section 360-bbb-3(b)(1), unless the authorization is terminated or revoked sooner.  Performed at [facility], [address] [place], [place] [address]       Studies: No results found.  Scheduled Meds: . albuterol  2 puff Inhalation Q6H  . allopurinol  50 mg Oral Daily  . vitamin C  500 mg Oral Daily  . benzonatate  100 mg Oral TID  . cholecalciferol  1,000 Units Oral Daily  . enoxaparin (LOVENOX) injection  40 mg Subcutaneous Daily  . fenofibrate  54 mg Oral Daily  . guaiFENesin  600 mg Oral BID  . insulin aspart  0-6 Units Subcutaneous TID WC  . methylPREDNISolone (SOLU-MEDROL) injection  0.5 mg/kg Intravenous BID  . traZODone  150 mg Oral Daily  . zinc sulfate  220 mg Oral Daily    Continuous Infusions: . remdesivir 100 mg in NS 100 mL 100 mg (01/03/20 1058)     LOS: 3 days     Darlin Drop,  MD [facility] Pager [phone]  If 7PM-7AM, please contact night-coverage  [email or web address] Password [email or web address] 01/03/2020, 4:38 PM

## 2020-01-04 ENCOUNTER — Inpatient Hospital Stay (HOSPITAL_COMMUNITY): Payer: 59

## 2020-01-04 LAB — GLUCOSE, CAPILLARY
Glucose-Capillary: 115 mg/dL — ABNORMAL HIGH (ref 70–99)
Glucose-Capillary: 155 mg/dL — ABNORMAL HIGH (ref 70–99)
Glucose-Capillary: 181 mg/dL — ABNORMAL HIGH (ref 70–99)
Glucose-Capillary: 237 mg/dL — ABNORMAL HIGH (ref 70–99)

## 2020-01-04 LAB — COMPREHENSIVE METABOLIC PANEL
ALT: 107 U/L — ABNORMAL HIGH (ref 0–44)
AST: 46 U/L — ABNORMAL HIGH (ref 15–41)
Albumin: 3.1 g/dL — ABNORMAL LOW (ref 3.5–5.0)
Alkaline Phosphatase: 41 U/L (ref 38–126)
Anion gap: 15 (ref 5–15)
BUN: 33 mg/dL — ABNORMAL HIGH (ref 6–20)
CO2: 23 mmol/L (ref 22–32)
Calcium: 9.4 mg/dL (ref 8.9–10.3)
Chloride: 99 mmol/L (ref 98–111)
Creatinine, Ser: 1.33 mg/dL — ABNORMAL HIGH (ref 0.61–1.24)
GFR calc Af Amer: >60 mL/min (ref >60)
GFR calc non Af Amer: >60 mL/min (ref >60)
Glucose, Bld: 135 mg/dL — ABNORMAL HIGH (ref 70–99)
Potassium: 5.1 mmol/L (ref 3.5–5.1)
Sodium: 137 mmol/L (ref 135–145)
Total Bilirubin: 0.9 mg/dL (ref 0.3–1.2)
Total Protein: 6.5 g/dL (ref 6.5–8.1)

## 2020-01-04 LAB — D-DIMER, QUANTITATIVE: D-Dimer, Quant: 1.19 ug/mL-FEU — ABNORMAL HIGH (ref 0.00–0.50)

## 2020-01-04 LAB — FERRITIN: Ferritin: 1130 ng/mL — ABNORMAL HIGH (ref 24–336)

## 2020-01-04 LAB — C-REACTIVE PROTEIN: CRP: 1.4 mg/dL — ABNORMAL HIGH (ref <1.0)

## 2020-01-04 MED ORDER — AZITHROMYCIN 250 MG PO TABS
500.0000 mg | ORAL_TABLET | Freq: Every day | ORAL | Status: AC
  Administered 2020-01-04: 500 mg via ORAL
  Filled 2020-01-04: qty 2

## 2020-01-04 MED ORDER — FUROSEMIDE 10 MG/ML IJ SOLN
20.0000 mg | Freq: Two times a day (BID) | INTRAMUSCULAR | Status: AC
  Administered 2020-01-04: 20 mg via INTRAVENOUS
  Filled 2020-01-04: qty 2

## 2020-01-04 MED ORDER — AZITHROMYCIN 250 MG PO TABS
250.0000 mg | ORAL_TABLET | Freq: Every day | ORAL | Status: DC
  Administered 2020-01-05: 250 mg via ORAL
  Filled 2020-01-04: qty 1

## 2020-01-04 NOTE — TOC Initial Note (Addendum)
Transition of Care [facility]) - Initial/Assessment Note    Patient Details  Name: Joshua [name] MRN: [id number] Date of Birth: 09-Oct-1965  Transition of [facility]) CM/SW Contact:    Clearance Coots, LCSW Phone Number: 01/04/2020, 2:09 PM  Clinical Narrative:     Anticapte discharge 9/18.             CSW reached out to the patient by phone to discuss discharge plan to home with oxygen. Patient agreeable to the plan. Patient reports his spouse will be at home when the oxygen is delivered today. CSW reached out spouse and notified her about the oxygen delivery. Spouse plans to transport the pt. Home. A Travel tank will be delivered to the patient bedside prior to discharge.  CSW made a referral to [facility].   Patient declines RW and additional DME.  Expected Discharge Plan: Home/Self Care Barriers to Discharge: Continued Medical Work up   Patient Goals and CMS Choice Patient states their goals for this hospitalization and ongoing recovery are:: return home CMS Medicare.gov Compare Post Acute Care list provided to:: Patient Choice offered to / list presented to : Patient  Expected Discharge Plan and Services Expected Discharge Plan: Home/Self Care                         DME Arranged: Oxygen DME Agency: [facility] Date DME Agency Contacted: 01/04/20 Time DME Agency Contacted: 1409 Representative spoke with at DME Agency: Tamela Oddi            Prior Living Arrangements/Services     Patient language and need for interpreter reviewed:: No Do you feel safe going back to the place where you live?: Yes      Need for Family Participation in Patient Care: Yes (Comment) Care giver support system in place?: Yes (comment)   Criminal Activity/Legal Involvement Pertinent to Current Situation/Hospitalization: No - Comment as needed  Activities of Daily Living Home Assistive Devices/Equipment: Eyeglasses ADL Screening (condition at time of admission) Patient's cognitive ability  adequate to safely complete daily activities?: Yes Is the patient deaf or have difficulty hearing?: No Does the patient have difficulty seeing, even when wearing glasses/contacts?: No Does the patient have difficulty concentrating, remembering, or making decisions?: No Patient able to express need for assistance with ADLs?: Yes Does the patient have difficulty dressing or bathing?: No Independently performs ADLs?: Yes (appropriate for developmental age) Does the patient have difficulty walking or climbing stairs?: Yes (secondary to shortness of breath) Weakness of Legs: Both Weakness of Arms/Hands: None  Permission Sought/Granted Permission sought to share information with : Family Supports Permission granted to share information with : Yes, Verbal Permission Granted     Permission granted to share info w AGENCY: [facility]  Permission granted to share info w Relationship: Spouse     Emotional Assessment   Attitude/Demeanor/Rapport: Engaged Affect (typically observed): Accepting Orientation: : Oriented to Situation, Oriented to  Time, Oriented to Place, Oriented to Self Alcohol / Substance Use: Not Applicable Psych Involvement: No (comment)  Admission diagnosis:  Acute respiratory failure with hypoxia (HCC) [J96.01] Pneumonia due to COVID-19 virus [U07.1, J12.82] COVID-19 [U07.1] Patient Active Problem List   Diagnosis Date Noted  . Acute respiratory disease due to COVID-19 virus 12/31/2019  . Hypertriglyceridemia 12/31/2019  . Gout 12/31/2019  . Pneumonia due to COVID-19 virus 12/31/2019  . Lactic acidosis 12/31/2019  . SIRS (systemic inflammatory response syndrome) (HCC) 12/31/2019  . Perirectal abscess 06/28/2019   PCP:  Lenise Arena,  Jeannett Senior, MD Pharmacy:   [facility] [address], [place] - [address] [place] [address] Phone: [phone] Fax: [phone]     Social Determinants of Health (SDOH) Interventions     Readmission Risk Interventions No flowsheet data found.

## 2020-01-04 NOTE — Progress Notes (Signed)
PROGRESS NOTE  Lars MageJohn Butson ZOX:[id number] DOB: 06/13/1965 DOA: 12/31/2019 PCP: Joycelyn RuaMeyers, Stephen, MD  HPI/Recap of past 24 hours: Joshua BushyJohn Presnellis a 54 y.o.malewithgout, hypertriglyceridemia and hypogonadism [place] [facility] [place] through EMS with generalized weakness, body aches, and nonproductive cough, mild wheezing and fever 101 at home.  ED Course:145/85, pulse 102-110, RR 36 SPO2 97% on 3 L nasal cannula T-max 99 F  CXR with mild hazy bilateral opacities Sodium 138, potassium 4.1, BUN 18, creatinine 1.15 WBC 7.6, Hb 16.9, platelets 142  Albumin 3.4, AST 49, ALP 37 Lactic acid 2.8-->1.4 Covid PCR positive on 12/31/19. Blood cultures x2 negative to date.  01/04/20: Seen and examined.  Persistent dyspnea with minimal exertion and generalized weakness.  Currently on 4 L with O2 saturation 90%.  Will continue to treat aggressively.  Assessment/Plan: Active Problems:   Acute respiratory disease due to COVID-19 virus   Hypertriglyceridemia   Gout   Pneumonia due to COVID-19 virus   Lactic acidosis   SIRS (systemic inflammatory response syndrome) (HCC)  COVID-19 viral pneumonia COVID-19 screening test positive on 12/31/2019. Currently on IV Solu-Medrol 35 mg twice daily and remdesivir day #4 Continue incentive spirometer and flutter valve Pronate as tolerated Maintain O2 saturation greater than 90% Continue vitamin C, and zinc, add vitamin D3. Continue bronchodilators Continue to trend inflammatory markers Inflammatory markers are downtrending Reviewed chest x-ray done today, showing improvement of bilateral pulmonary infiltrates. Added Z-Pak for its anti-inflammatory properties.  Acute hypoxic respiratory failure secondary to COVID-19 viral pneumonia Management as stated above Independently reviewed chest x-ray which shows bilateral pulmonary infiltrates consistent with COVID-19 viral pneumonia. Rest of management as stated above.  We will give 2 doses of IV Lasix 20 mg x 2, repeat x2 doses, repeat x2 doses. Net I&O +2.9 L> + 4cc, doubt accuracy, diurese to keep on the dry side. O2 saturation 90% on 4 L from 95% on 4 L from 6 L previously. Continue aggressive medical management  Hyperglycemia, likely exacerbated by IV steroids Hemoglobin A1c 5.2 Continue insulin sliding scale while on IV steroids  Obesity BMI 34 Recommend weight loss outpatient with regular physical activity and healthy dieting.  Code Status: Full code  Family Communication: None at bedside  Disposition Plan:    Consultants:  None  Procedures:  None  Antimicrobials:  None  DVT prophylaxis: Subcu Lovenox daily  Status is: Inpatient    Dispo: The patient is from: Home.               Anticipated d/c is to: Home.               Anticipated d/c date is: 01/05/2020              Patient currently not stable for discharge due to ongoing management of Covid-19 viral PNA         Objective: Vitals:   01/03/20 0409 01/03/20 1337 01/03/20 2134 01/04/20 0354  BP: 127/70 (!) 154/79 132/83 115/75  Pulse: (!) 59 (!) 58 67 65  Resp: (!) 25 16 19 20   Temp: 97.9 F (36.6 C) 98.4 F (36.9 C) 98.5 F (36.9 C) 98 F (36.7 C)  TempSrc:  Oral    SpO2: 91% 95% 95% 90%  Weight:      Height:        Intake/Output Summary (Last 24 hours) at 01/04/2020 1239 Last data filed at 01/04/2020 1231 Gross per 24 hour  Intake 1770 ml  Output 3850 ml  Net -  2080 ml   Filed Weights   12/31/19 0137  Weight: 108.9 kg    Exam:  . General: 54 y.o. year-old male pleasant well-developed and nourished.  Alert oriented x3. . Cardiovascular: Regular rate and rhythm no rubs or gallops. Marland Kitchen Respiratory: Improved mild rales at bases no wheezing noted.   . Abdomen: Obese nontender normal bowel sounds present . Musculoskeletal: No lower extremity edema bilaterally.   Marland Kitchen Psychiatry: Mood is appropriate for condition and setting.  Data Reviewed: CBC:  Recent Labs  Lab 12/31/19 0141 01/01/20 0613 01/02/20 0300 01/03/20 0355  WBC 17.6* 15.6* 17.7* 13.3*  NEUTROABS 16.4*  --   --   --   HGB 16.9 15.9 16.6 16.7  HCT 48.3 46.2 48.4 50.5  MCV 91.8 93.3 93.4 95.1  PLT 142* 186 202 234   Basic Metabolic Panel: Recent Labs  Lab 12/31/19 0141 12/31/19 0539 01/01/20 0613 01/02/20 0300 01/03/20 0355 01/04/20 0358  NA 138  --  135 136 137 137  K 4.1  --  4.4 4.6 4.5 5.1  CL 104  --  102 108 105 99  CO2 21*  --  22 21* 23 23  GLUCOSE 136*  --  135* 142* 127* 135*  BUN 18  --  19 23* 25* 33*  CREATININE 1.15  --  0.92 0.90 0.99 1.33*  CALCIUM 9.0  --  8.6* 8.6* 8.9 9.4  MG  --  1.9  --   --   --   --    GFR: Estimated Creatinine Clearance: 78.5 mL/min (A) (by C-G formula based on SCr of 1.33 mg/dL (H)). Liver Function Tests: Recent Labs  Lab 12/31/19 0141 01/01/20 0613 01/02/20 0300 01/03/20 0355 01/04/20 0358  AST 49* 32 42* 53* 46*  ALT 44 33 58* 114* 107*  ALKPHOS 37* 34* 36* 38 41  BILITOT 0.7 1.0 0.8 0.8 0.9  PROT 6.9 6.5 6.3* 6.2* 6.5  ALBUMIN 3.4* 2.9* 2.9* 2.9* 3.1*   No results for input(s): LIPASE, AMYLASE in the last 168 hours. No results for input(s): AMMONIA in the last 168 hours. Coagulation Profile: Recent Labs  Lab 12/31/19 0141  INR 1.0   Cardiac Enzymes: No results for input(s): CKTOTAL, CKMB, CKMBINDEX, TROPONINI in the last 168 hours. BNP (last 3 results) No results for input(s): PROBNP in the last 8760 hours. HbA1C: No results for input(s): HGBA1C in the last 72 hours. CBG: Recent Labs  Lab 01/03/20 1124 01/03/20 1625 01/03/20 2131 01/04/20 0757 01/04/20 1155  GLUCAP 171* 190* 128* 115* 155*   Lipid Profile: No results for input(s): CHOL, HDL, LDLCALC, TRIG, CHOLHDL, LDLDIRECT in the last 72 hours. Thyroid Function Tests: No results for input(s): TSH, T4TOTAL, FREET4, T3FREE, THYROIDAB in the last 72 hours. Anemia Panel: Recent Labs    01/03/20 0355 01/04/20 0358  FERRITIN  1,039* 1,130*   Urine analysis:    Component Value Date/Time   COLORURINE YELLOW 12/31/2019 0350   APPEARANCEUR CLEAR 12/31/2019 0350   LABSPEC 1.024 12/31/2019 0350   PHURINE 6.0 12/31/2019 0350   GLUCOSEU NEGATIVE 12/31/2019 0350   HGBUR NEGATIVE 12/31/2019 0350   BILIRUBINUR NEGATIVE 12/31/2019 0350   KETONESUR NEGATIVE 12/31/2019 0350   PROTEINUR 30 (A) 12/31/2019 0350   NITRITE NEGATIVE 12/31/2019 0350   LEUKOCYTESUR NEGATIVE 12/31/2019 0350   Sepsis Labs: @LABRCNTIP (procalcitonin:4,lacticidven:4)  ) Recent Results (from the past 240 hour(s))  Culture, blood (Routine x 2)     Status: None (Preliminary result)   Collection Time: 12/31/19  1:41 AM  Specimen: BLOOD  Result Value Ref Range Status   Specimen Description   Final    BLOOD RIGHT ANTECUBITAL Performed at [facility], [address], [place], [place] [address]    Special Requests   Final    BOTTLES DRAWN AEROBIC AND ANAEROBIC Blood Culture adequate volume Performed at [facility], [address], [place], [place] [address]    Culture   Final    NO GROWTH 4 DAYS Performed at [facility], [address] [place], [place] [address]    Report Status PENDING  Incomplete  Culture, blood (Routine x 2)     Status: None (Preliminary result)   Collection Time: 12/31/19  1:41 AM   Specimen: BLOOD  Result Value Ref Range Status   Specimen Description   Final    BLOOD LEFT ANTECUBITAL Performed at [facility], [address] [place], [place] [address]    Special Requests   Final    BOTTLES DRAWN AEROBIC AND ANAEROBIC Blood Culture adequate volume Performed at [facility], [address], [place], [place] [address]    Culture   Final    NO GROWTH 4 DAYS Performed at [facility], [address], [place], [place] [address]    Report Status PENDING  Incomplete  SARS Coronavirus 2 by RT PCR (hospital order, performed in  [facility] hospital lab) Nasopharyngeal Nasopharyngeal Swab     Status: Abnormal   Collection Time: 12/31/19  1:41 AM   Specimen: Nasopharyngeal Swab  Result Value Ref Range Status   SARS Coronavirus 2 POSITIVE (A) NEGATIVE Final    Comment: RESULT CALLED TO, READ BACK BY AND VERIFIED WITH: S,DOSTER AT 0340 ON 12/31/19 BY A,MOHAMED (NOTE) SARS-CoV-2 target nucleic acids are DETECTED  SARS-CoV-2 RNA is generally detectable in upper respiratory specimens  during the acute phase of infection.  Positive results are indicative  of the presence of the identified virus, but do not rule out bacterial infection or co-infection with other pathogens not detected by the test.  Clinical correlation with patient history and  other diagnostic information is necessary to determine patient infection status.  The expected result is negative.  Fact Sheet for Patients:   BoilerBrush.com.cy   Fact Sheet for Healthcare Providers:   https://pope.com/    This test is not yet approved or cleared by the [place] FDA and  has been authorized for detection and/or diagnosis of SARS-CoV-2 by FDA under an Emergency Use Authorization (EUA).  This EUA will remain in effect (meaning t his test can be used) for the duration of  the COVID-19 declaration under Section 564(b)(1) of the Act, 21 U.S.C. section 360-bbb-3(b)(1), unless the authorization is terminated or revoked sooner.  Performed at [facility], [address], [place], [place] [address]       Studies: DG CHEST PORT 1 VIEW  Result Date: 01/04/2020 CLINICAL DATA:  Hypoxia EXAM: PORTABLE CHEST 1 VIEW COMPARISON:  12/31/2019 FINDINGS: Patchy bilateral airspace disease again noted, slightly improved. Mild cardiomegaly. No effusions or pneumothorax. IMPRESSION: Patchy bilateral airspace disease with slight improvement since prior study. Electronically Signed   By: Charlett Nose M.D.    On: 01/04/2020 06:04    Scheduled Meds: . albuterol  2 puff Inhalation Q6H  . allopurinol  50 mg Oral Daily  . vitamin C  500 mg Oral Daily  . benzonatate  100 mg Oral TID  . cholecalciferol  1,000 Units Oral Daily  .  enoxaparin (LOVENOX) injection  40 mg Subcutaneous Daily  . fenofibrate  54 mg Oral Daily  . furosemide  20 mg Intravenous BID  . guaiFENesin  600 mg Oral BID  . insulin aspart  0-6 Units Subcutaneous TID WC  . methylPREDNISolone (SOLU-MEDROL) injection  0.5 mg/kg Intravenous BID  . traZODone  150 mg Oral Daily  . zinc sulfate  220 mg Oral Daily    Continuous Infusions:    LOS: 4 days     Darlin Drop, MD [facility] Pager [phone]  If 7PM-7AM, please contact night-coverage [email or web address] Password [facility] 01/04/2020, 12:39 PM

## 2020-01-05 LAB — C-REACTIVE PROTEIN: CRP: 0.8 mg/dL (ref <1.0)

## 2020-01-05 LAB — FERRITIN: Ferritin: 909 ng/mL — ABNORMAL HIGH (ref 24–336)

## 2020-01-05 LAB — BASIC METABOLIC PANEL
Anion gap: 12 (ref 5–15)
BUN: 36 mg/dL — ABNORMAL HIGH (ref 6–20)
CO2: 24 mmol/L (ref 22–32)
Calcium: 9.1 mg/dL (ref 8.9–10.3)
Chloride: 100 mmol/L (ref 98–111)
Creatinine, Ser: 1.07 mg/dL (ref 0.61–1.24)
GFR calc Af Amer: >60 mL/min (ref >60)
GFR calc non Af Amer: >60 mL/min (ref >60)
Glucose, Bld: 142 mg/dL — ABNORMAL HIGH (ref 70–99)
Potassium: 4.6 mmol/L (ref 3.5–5.1)
Sodium: 136 mmol/L (ref 135–145)

## 2020-01-05 LAB — CULTURE, BLOOD (ROUTINE X 2)
Culture: NO GROWTH
Culture: NO GROWTH
Special Requests: ADEQUATE
Special Requests: ADEQUATE

## 2020-01-05 LAB — D-DIMER, QUANTITATIVE: D-Dimer, Quant: 0.9 ug/mL-FEU — ABNORMAL HIGH (ref 0.00–0.50)

## 2020-01-05 LAB — GLUCOSE, CAPILLARY: Glucose-Capillary: 159 mg/dL — ABNORMAL HIGH (ref 70–99)

## 2020-01-05 MED ORDER — VITAMIN D3 25 MCG PO TABS: 1000.0000 [IU] | ORAL_TABLET | Freq: Every day | ORAL | 0 refills | Status: AC

## 2020-01-05 MED ORDER — AZITHROMYCIN 250 MG PO TABS: ORAL_TABLET | ORAL | 0 refills | Status: DC

## 2020-01-05 MED ORDER — ASCORBIC ACID 500 MG PO TABS: 500.0000 mg | ORAL_TABLET | Freq: Every day | ORAL | 0 refills | Status: AC

## 2020-01-05 MED ORDER — ZINC SULFATE 220 (50 ZN) MG PO CAPS: 220.0000 mg | ORAL_CAPSULE | Freq: Every day | ORAL | 0 refills | Status: AC

## 2020-01-05 MED ORDER — PREDNISONE 10 MG PO TABS: 10.0000 mg | ORAL_TABLET | Freq: Every day | ORAL | 0 refills | Status: DC

## 2020-01-05 MED ORDER — ALBUTEROL SULFATE HFA 108 (90 BASE) MCG/ACT IN AERS: 2.0000 | INHALATION_SPRAY | Freq: Four times a day (QID) | RESPIRATORY_TRACT | 0 refills | Status: DC

## 2020-01-05 NOTE — Discharge Instructions (Signed)
10 Things You Can Do to Manage Your COVID-19 Symptoms at Home If you have possible or confirmed COVID-19: 1. Stay home from work and school. And stay away from other public places. If you must go out, avoid using any kind of public transportation, ridesharing, or taxis. 2. Monitor your symptoms carefully. If your symptoms get worse, call your healthcare provider immediately. 3. Get rest and stay hydrated. 4. If you have a medical appointment, call the healthcare provider ahead of time and tell them that you have or may have COVID-19. 5. For medical emergencies, call 911 and notify the dispatch personnel that you have or may have COVID-19. 6. Cover your cough and sneezes with a tissue or use the inside of your elbow. 7. Wash your hands often with soap and water for at least 20 seconds or clean your hands with an alcohol-based hand sanitizer that contains at least 60% alcohol. 8. As much as possible, stay in a specific room and away from other people in your home. Also, you should use a separate bathroom, if available. If you need to be around other people in or outside of the home, wear a mask. 9. Avoid sharing personal items with other people in your household, like dishes, towels, and bedding. 10. Clean all surfaces that are touched often, like counters, tabletops, and doorknobs. Use household cleaning sprays or wipes according to the label instructions. cdc.gov/coronavirus 10/18/2018 This information is not intended to replace advice given to you by your health care provider. Make sure you discuss any questions you have with your health care provider. Document Revised: 03/22/2019 Document Reviewed: 03/22/2019 Elsevier Patient Education  2020 Elsevier Inc.  COVID-19: How to Protect Yourself and Others Know how it spreads  There is currently no vaccine to prevent coronavirus disease 2019 (COVID-19).  The best way to prevent illness is to avoid being exposed to this virus.  The virus is  thought to spread mainly from person-to-person. ? Between people who are in close contact with one another (within about 6 feet). ? Through respiratory droplets produced when an infected person coughs, sneezes or talks. ? These droplets can land in the mouths or noses of people who are nearby or possibly be inhaled into the lungs. ? COVID-19 may be spread by people who are not showing symptoms. Everyone should Clean your hands often  Wash your hands often with soap and water for at least 20 seconds especially after you have been in a public place, or after blowing your nose, coughing, or sneezing.  If soap and water are not readily available, use a hand sanitizer that contains at least 60% alcohol. Cover all surfaces of your hands and rub them together until they feel dry.  Avoid touching your eyes, nose, and mouth with unwashed hands. Avoid close contact  Limit contact with others as much as possible.  Avoid close contact with people who are sick.  Put distance between yourself and other people. ? Remember that some people without symptoms may be able to spread virus. ? This is especially important for people who are at higher risk of getting very sick.www.cdc.gov/coronavirus/2019-ncov/need-extra-precautions/people-at-higher-risk.html Cover your mouth and nose with a mask when around others  You could spread COVID-19 to others even if you do not feel sick.  Everyone should wear a mask in public settings and when around people not living in their household, especially when social distancing is difficult to maintain. ? Masks should not be placed on young children under age 2, anyone who   has trouble breathing, or is unconscious, incapacitated or otherwise unable to remove the mask without assistance.  The mask is meant to protect other people in case you are infected.  Do NOT use a facemask meant for a healthcare worker.  Continue to keep about 6 feet between yourself and others. The  mask is not a substitute for social distancing. Cover coughs and sneezes  Always cover your mouth and nose with a tissue when you cough or sneeze or use the inside of your elbow.  Throw used tissues in the trash.  Immediately wash your hands with soap and water for at least 20 seconds. If soap and water are not readily available, clean your hands with a hand sanitizer that contains at least 60% alcohol. Clean and disinfect  Clean AND disinfect frequently touched surfaces daily. This includes tables, doorknobs, light switches, countertops, handles, desks, phones, keyboards, toilets, faucets, and sinks. www.cdc.gov/coronavirus/2019-ncov/prevent-getting-sick/disinfecting-your-home.html  If surfaces are dirty, clean them: Use detergent or soap and water prior to disinfection.  Then, use a household disinfectant. You can see a list of EPA-registered household disinfectants here. cdc.gov/coronavirus 12/20/2018 This information is not intended to replace advice given to you by your health care provider. Make sure you discuss any questions you have with your health care provider. Document Revised: 12/28/2018 Document Reviewed: 10/26/2018 Elsevier Patient Education  2020 Elsevier Inc.  

## 2020-01-05 NOTE — Progress Notes (Signed)
TOC CM received call to follow up on oxygen concentrator for home. Contacted wife and states [employer] has not delivered to home. Spoke to Unit RN and pt's has his portable in the room. Contacted [employer] to get an ETA on oxygen concentrator to home. They are going to deliver this am, it will be the driver's first stop. Will contact wife to make aware. Isidoro Donning RN CCM, [facility] TOC CM [phone]

## 2020-01-05 NOTE — Discharge Summary (Addendum)
Discharge Summary  Joshua [name] NID:[id number] DOB: 1965/10/07  PCP: Joycelyn Rua, MD  Admit date: 12/31/2019 Discharge date: 01/06/2020  Time spent: 35 minutes  Recommendations for Outpatient Follow-up:  1. Follow-up with your primary care provider. 2. Take your medications as prescribed. 3. Maintain your O2 saturation greater than 92%. 4. Use an incentive spirometer and mobilize as tolerated. 5. Please abide by CDC recommendations on isolation, attached.  Discharge Diagnoses:  Active Hospital Problems   Diagnosis Date Noted  . Acute respiratory disease due to COVID-19 virus 12/31/2019  . Hypertriglyceridemia 12/31/2019  . Gout 12/31/2019  . Sepsis due to COVID-19 (HCC) 12/31/2019  . Lactic acidosis 12/31/2019  . SIRS (systemic inflammatory response syndrome) (HCC) 12/31/2019    Resolved Hospital Problems  No resolved problems to display.    Discharge Condition: Stable  Diet recommendation: Resume previous diet.  Vitals:   01/04/20 1957 01/05/20 0429  BP: (!) 142/83 128/80  Pulse: 85 69  Resp: (!) 22 (!) 21  Temp: 98.7 F (37.1 C) 97.8 F (36.6 C)  SpO2: 93% 91%    History of present illness:  Joshua [name] a 54 y.o.malewithgout, hypertriglyceridemia and hypogonadism presented after visiting [facility] on 12/22/2019 weekend through EMS with generalized weakness, body aches, and nonproductive cough, mild wheezing and fever 101 at home.  ED Course:145/85, pulse 102-110, RR 36 SPO2 97% on 3 L nasal cannula T-max 99 F  CXR with hazy bilateral opacities Sodium 138, potassium 4.1, BUN 18, creatinine 1.15 WBC 7.6, Hb 16.9, platelets 142  Albumin 3.4, AST 49, ALP 37 Lactic acid 2.8-->1.4 Covid PCR positive on 12/31/19. Blood cultures x2 negative to date.  Received COVID-19 directed therapies which included 5 days of IV remdesivir, 6 days of IV Solu-Medrol, bronchodilators, pulmonary toilet, antitussives, vitamin C, D3 and zinc.  Great collaboration,  mobilized as recommended.  01/05/20: Seen and examined.  No acute events overnight.  Feels better, lungs sounds, aeration are improved.  Advised to obtain a pulse oximeter, continue to mobilize, follow-up with PCP, and wean off oxygen as tolerated.   Hospital Course:  Active Problems:   Acute respiratory disease due to COVID-19 virus   Hypertriglyceridemia   Gout   Sepsis due to COVID-19 (HCC)   Lactic acidosis   SIRS (systemic inflammatory response syndrome) (HCC)  Sepsis 2/2 to COVID-19 viral pneumonia Presented with tachycardia 110, tachypnea 36, multifocal bilateral pulmonary infiltrates on CXR and + COVID-19 screening test on 12/31/19. Received 6 days of IV Solu-Medrol 55 mg twice daily and 5 days of IV remdesivir  Continue prednisone taper x 15 days as instructed on the label. Continue p.o. azithromycin 250 mg daily x5 days, use for its antiinflammatory properties. Continue incentive spirometer Pronate as tolerated Maintain O2 saturation greater than 92% Continue to wean off oxygen supplementation as tolerated. Continue vitamin C, zinc, and vitamin D3. Continue bronchodilator, albuterol inhaler every 6 hours Inflammatory markers are trending down.  CRP has normalized, from 15 to 0.8. Follow-up with your PCP  Acute hypoxic respiratory failure secondary to COVID-19 viral pneumonia Management as stated above Wean off oxygen supplementation as tolerated.  Hyperglycemia, likely exacerbated by IV steroids Hemoglobin A1c 5.2 on 12/31/2019. Steroids are being tapered off, blood sugar should improve. Follow-up with your PCP  Obesity BMI 34 Recommend weight loss outpatient with regular physical activity and healthy dieting.  Code Status: Full code  Family Communication:  Discussed plan with his wife on the phone on 01/05/2020.    Consultants:  None  Procedures:  None  Antimicrobials:  None   Discharge Exam: BP 128/80 (BP Location: Right Arm)   Pulse 69    Temp 97.8 F (36.6 C)   Resp (!) 21   Ht 5\' 10"  (1.778 m)   Wt 108.9 kg   SpO2 91%   BMI 34.44 kg/m  . General: 54 y.o. year-old male well developed well nourished in no acute distress.  Alert and oriented x3. . Cardiovascular: Regular rate and rhythm with no rubs or gallops.  No thyromegaly or JVD noted.   Marland Kitchen. Respiratory: Clear to auscultation with no wheezes or rales. Good inspiratory effort. . Abdomen: Soft nontender nondistended with normal bowel sounds x4 quadrants. . Musculoskeletal: No lower extremity edema. 2/4 pulses in all 4 extremities. Marland Kitchen. Psychiatry: Mood is appropriate for condition and setting  Discharge Instructions You were cared for by a hospitalist during your hospital stay. If you have any questions about your discharge medications or the care you received while you were in the hospital after you are discharged, you can call the unit and asked to speak with the hospitalist on call if the hospitalist that took care of you is not available. Once you are discharged, your primary care physician will handle any further medical issues. Please note that NO REFILLS for any discharge medications will be authorized once you are discharged, as it is imperative that you return to your primary care physician (or establish a relationship with a primary care physician if you do not have one) for your aftercare needs so that they can reassess your need for medications and monitor your lab values.   Allergies as of 01/05/2020      Reactions   Penicillins    Has taken Penicillins as an adult       Medication List    STOP taking these medications   acetaminophen 500 MG tablet Commonly known as: TYLENOL   ibuprofen 200 MG tablet Commonly known as: ADVIL     TAKE these medications   albuterol 108 (90 Base) MCG/ACT inhaler Commonly known as: VENTOLIN HFA Inhale 2 puffs into the lungs every 6 (six) hours for 7 days.   allopurinol 100 MG tablet Commonly known as: ZYLOPRIM Take 50  mg by mouth daily.   ascorbic acid 500 MG tablet Commonly known as: VITAMIN C Take 1 tablet (500 mg total) by mouth daily.   azithromycin 250 MG tablet Commonly known as: ZITHROMAX Take 1 tablet 250 mg daily x 5 days   butalbital-acetaminophen-caffeine 50-325-40 MG tablet Commonly known as: FIORICET Take 1 tablet by mouth every 8 (eight) hours.   fenofibrate 54 MG tablet Take 54 mg by mouth daily.   GOLD BOND EX Apply 1 application topically as needed (irritation).   naproxen sodium 220 MG tablet Commonly known as: ALEVE Take 440 mg by mouth 2 (two) times daily as needed (pain).   oxyCODONE 5 MG immediate release tablet Commonly known as: Oxy IR/ROXICODONE Take 1-2 tablets (5-10 mg total) by mouth every 6 (six) hours as needed for moderate pain, severe pain or breakthrough pain.   polyethylene glycol 17 g packet Commonly known as: MIRALAX / GLYCOLAX Take 17 g by mouth daily.   predniSONE 10 MG tablet Commonly known as: DELTASONE Take 1 tablet (10 mg total) by mouth daily. Take 50 mg daily x 3 days, then Take 40 mg daily x 3 days, then Take 30 mg daily x 3 days, then Take 20 mg daily x 3 days, then Take 10 mg daily x 3 days,  then Take 5 mg daily x 3 days, then stop.   testosterone cypionate 200 MG/ML injection Commonly known as: DEPOTESTOSTERONE CYPIONATE Inject 200 mg into the muscle every 14 (fourteen) days.   traZODone 150 MG tablet Commonly known as: DESYREL Take 150 mg by mouth daily.   Vitamin D3 25 MCG tablet Commonly known as: Vitamin D Take 1 tablet (1,000 Units total) by mouth daily.   zinc sulfate 220 (50 Zn) MG capsule Take 1 capsule (220 mg total) by mouth daily.      Allergies  Allergen Reactions  . Penicillins     Has taken Penicillins as an adult     Follow-up Information    Joycelyn Rua, MD. Call in 1 day(s).   Specialty: Family Medicine Why: Please call for a post hospital follow up appointment Contact information: [address] [facility]  [address] [place] [address] [phone]                The results of significant diagnostics from this hospitalization (including imaging, microbiology, ancillary and laboratory) are listed below for reference.    Significant Diagnostic Studies: CT ANGIO CHEST PE W OR WO CONTRAST  Result Date: 01/01/2020 CLINICAL DATA:  PE suspected, high probability EXAM: CT ANGIOGRAPHY CHEST WITH CONTRAST TECHNIQUE: Multidetector CT imaging of the chest was performed using the standard protocol during bolus administration of intravenous contrast. Multiplanar CT image reconstructions and MIPs were obtained to evaluate the vascular anatomy. CONTRAST:  OMNIPAQUE IOHEXOL 350 MG/ML SOLN COMPARISON:  Abdomen and pelvis CT from March of 2021 FINDINGS: Cardiovascular: Normal caliber thoracic aorta. Heart size is normal without pericardial effusion. Exam limited by respiratory motion and bolus timing. No central or lobar pulmonary embolism. Mediastinum/Nodes: Scattered small lymph nodes throughout the chest none displaying pathologic enlargement. Lungs/Pleura: Patchy areas of ground-glass opacity and septal thickening throughout the chest more pronounced at the lung bases and mid chest. No effusion. Upper Abdomen: Post cholecystectomy. No biliary duct distension. Liver is incompletely imaged. No acute upper abdominal process to the extent evaluated. Musculoskeletal: No acute musculoskeletal finding. Spinal degenerative changes. Review of the MIP images confirms the above findings. IMPRESSION: 1. Exam limited by respiratory motion and bolus timing. No central or lobar pulmonary embolism. 2. Patchy areas of ground-glass opacity and septal thickening throughout the chest more pronounced at the lung bases and mid chest. Findings of multifocal pneumonia in the setting of COVID-19 infection. Electronically Signed   By: Donzetta Kohut M.D.   On: 01/01/2020 11:28   DG CHEST PORT 1 VIEW  Result Date:  01/04/2020 CLINICAL DATA:  Hypoxia EXAM: PORTABLE CHEST 1 VIEW COMPARISON:  12/31/2019 FINDINGS: Patchy bilateral airspace disease again noted, slightly improved. Mild cardiomegaly. No effusions or pneumothorax. IMPRESSION: Patchy bilateral airspace disease with slight improvement since prior study. Electronically Signed   By: Charlett Nose M.D.   On: 01/04/2020 06:04   DG Chest Port 1 View  Result Date: 12/31/2019 CLINICAL DATA:  Shortness of breath, cough and fever. EXAM: PORTABLE CHEST 1 VIEW COMPARISON:  None. FINDINGS: Mild hazy infiltrates are seen involving the bilateral lung bases and mid left lung. There is no evidence of a pleural effusion or pneumothorax. The cardiac silhouette is mildly enlarged. The visualized skeletal structures are unremarkable. IMPRESSION: Mild hazy bilateral infiltrates. Electronically Signed   By: Aram Candela M.D.   On: 12/31/2019 02:16   VAS [place] LOWER EXTREMITY VENOUS (DVT)  Result Date: 01/01/2020  Lower Venous DVTStudy Indications: Elevated d-dimer.  Comparison Study: No  prior studies. Performing Technologist: Jean Rosenthal  Examination Guidelines: A complete evaluation includes B-mode imaging, spectral Doppler, color Doppler, and power Doppler as needed of all accessible portions of each vessel. Bilateral testing is considered an integral part of a complete examination. Limited examinations for reoccurring indications may be performed as noted. The reflux portion of the exam is performed with the patient in reverse Trendelenburg.  +---------+---------------+---------+-----------+----------+--------------+ RIGHT    CompressibilityPhasicitySpontaneityPropertiesThrombus Aging +---------+---------------+---------+-----------+----------+--------------+ CFV      Full           Yes      Yes                                 +---------+---------------+---------+-----------+----------+--------------+ SFJ      Full                                                         +---------+---------------+---------+-----------+----------+--------------+ FV Prox  Full                                                        +---------+---------------+---------+-----------+----------+--------------+ FV Mid   Full                                                        +---------+---------------+---------+-----------+----------+--------------+ FV DistalFull                                                        +---------+---------------+---------+-----------+----------+--------------+ PFV      Full                                                        +---------+---------------+---------+-----------+----------+--------------+ POP      Full           Yes      Yes                                 +---------+---------------+---------+-----------+----------+--------------+ PTV      Full                                                        +---------+---------------+---------+-----------+----------+--------------+ PERO     Full                                                        +---------+---------------+---------+-----------+----------+--------------+   +---------+---------------+---------+-----------+----------+--------------+  LEFT     CompressibilityPhasicitySpontaneityPropertiesThrombus Aging +---------+---------------+---------+-----------+----------+--------------+ CFV      Full           Yes      Yes                                 +---------+---------------+---------+-----------+----------+--------------+ SFJ      Full                                                        +---------+---------------+---------+-----------+----------+--------------+ FV Prox  Full                                                        +---------+---------------+---------+-----------+----------+--------------+ FV Mid   Full                                                         +---------+---------------+---------+-----------+----------+--------------+ FV DistalFull                                                        +---------+---------------+---------+-----------+----------+--------------+ PFV      Full                                                        +---------+---------------+---------+-----------+----------+--------------+ POP      Full           Yes      Yes                                 +---------+---------------+---------+-----------+----------+--------------+ PTV      Full                                                        +---------+---------------+---------+-----------+----------+--------------+ PERO     Full                                                        +---------+---------------+---------+-----------+----------+--------------+     Summary: RIGHT: - There is no evidence of deep vein thrombosis in the lower extremity.  - No cystic structure found in the popliteal fossa.  LEFT: - There is no evidence of deep vein thrombosis in the lower extremity.  - No  cystic structure found in the popliteal fossa.  *See table(s) above for measurements and observations. Electronically signed by Gretta Began MD on 01/01/2020 at 5:02:32 PM.    Final     Microbiology: Recent Results (from the past 240 hour(s))  Culture, blood (Routine x 2)     Status: None   Collection Time: 12/31/19  1:41 AM   Specimen: BLOOD  Result Value Ref Range Status   Specimen Description   Final    BLOOD RIGHT ANTECUBITAL Performed at [facility], [address], [place], [place] [address]    Special Requests   Final    BOTTLES DRAWN AEROBIC AND ANAEROBIC Blood Culture adequate volume Performed at [facility], [address] [place], [place] [address]    Culture   Final    NO GROWTH 5 DAYS Performed at [facility], [address] [place], [place] [address]    Report Status 01/05/2020 FINAL  Final   Culture, blood (Routine x 2)     Status: None   Collection Time: 12/31/19  1:41 AM   Specimen: BLOOD  Result Value Ref Range Status   Specimen Description   Final    BLOOD LEFT ANTECUBITAL Performed at [facility], [address], [place], [place] [address]    Special Requests   Final    BOTTLES DRAWN AEROBIC AND ANAEROBIC Blood Culture adequate volume Performed at [facility], [address] [place], [place] [address]    Culture   Final    NO GROWTH 5 DAYS Performed at [facility], [address], [place], [place] [address]    Report Status 01/05/2020 FINAL  Final  SARS Coronavirus 2 by RT PCR (hospital order, performed in [facility] hospital lab) Nasopharyngeal Nasopharyngeal Swab     Status: Abnormal   Collection Time: 12/31/19  1:41 AM   Specimen: Nasopharyngeal Swab  Result Value Ref Range Status   SARS Coronavirus 2 POSITIVE (A) NEGATIVE Final    Comment: RESULT CALLED TO, READ BACK BY AND VERIFIED WITH: S,DOSTER AT 0340 ON 12/31/19 BY A,MOHAMED (NOTE) SARS-CoV-2 target nucleic acids are DETECTED  SARS-CoV-2 RNA is generally detectable in upper respiratory specimens  during the acute phase of infection.  Positive results are indicative  of the presence of the identified virus, but do not rule out bacterial infection or co-infection with other pathogens not detected by the test.  Clinical correlation with patient history and  other diagnostic information is necessary to determine patient infection status.  The expected result is negative.  Fact Sheet for Patients:   BoilerBrush.com.cy   Fact Sheet for Healthcare Providers:   https://pope.com/    This test is not yet approved or cleared by the [place] FDA and  has been authorized for detection and/or diagnosis of SARS-CoV-2 by FDA under an Emergency Use Authorization (EUA).  This EUA will remain in effect (meaning t  his test can be used) for the duration of  the COVID-19 declaration under Section 564(b)(1) of the Act, 21 U.S.C. section 360-bbb-3(b)(1), unless the authorization is terminated or revoked sooner.  Performed at [facility], [address], [place], [place] [address]      Labs: Basic Metabolic Panel: Recent Labs  Lab 12/31/19 0141 12/31/19 0539 01/01/20 2725 01/02/20 0300 01/03/20 0355 01/04/20 0358 01/05/20 0426  NA   < >  --  135 136 137 137 136  K   < >  --  4.4 4.6 4.5 5.1 4.6  CL   < >  --  102 108 105 99 100  CO2   < >  --  22 21* 23 23 24   GLUCOSE   < >  --  135* 142* 127* 135* 142*  BUN   < >  --  19 23* 25* 33* 36*  CREATININE   < >  --  0.92 0.90 0.99 1.33* 1.07  CALCIUM   < >  --  8.6* 8.6* 8.9 9.4 9.1  MG  --  1.9  --   --   --   --   --    < > = values in this interval not displayed.   Liver Function Tests: Recent Labs  Lab 12/31/19 0141 01/01/20 0613 01/02/20 0300 01/03/20 0355 01/04/20 0358  AST 49* 32 42* 53* 46*  ALT 44 33 58* 114* 107*  ALKPHOS 37* 34* 36* 38 41  BILITOT 0.7 1.0 0.8 0.8 0.9  PROT 6.9 6.5 6.3* 6.2* 6.5  ALBUMIN 3.4* 2.9* 2.9* 2.9* 3.1*   No results for input(s): LIPASE, AMYLASE in the last 168 hours. No results for input(s): AMMONIA in the last 168 hours. CBC: Recent Labs  Lab 12/31/19 0141 01/01/20 0613 01/02/20 0300 01/03/20 0355  WBC 17.6* 15.6* 17.7* 13.3*  NEUTROABS 16.4*  --   --   --   HGB 16.9 15.9 16.6 16.7  HCT 48.3 46.2 48.4 50.5  MCV 91.8 93.3 93.4 95.1  PLT 142* 186 202 234   Cardiac Enzymes: No results for input(s): CKTOTAL, CKMB, CKMBINDEX, TROPONINI in the last 168 hours. BNP: BNP (last 3 results) No results for input(s): BNP in the last 8760 hours.  ProBNP (last 3 results) No results for input(s): PROBNP in the last 8760 hours.  CBG: Recent Labs  Lab 01/04/20 0757 01/04/20 1155 01/04/20 1607 01/04/20 2047 01/05/20 0751  GLUCAP 115* 155* 181* 237* 159*        Signed:  01/07/20, MD [facility] 01/06/2020, 10:54 PM

## 2020-01-16 ENCOUNTER — Ambulatory Visit (HOSPITAL_COMMUNITY)
Admission: RE | Admit: 2020-01-16 | Discharge: 2020-01-16 | Disposition: A | Discharge status: Home / Self Care | Payer: 59 | Source: Ambulatory Visit | Attending: Surgery | Admitting: Surgery

## 2020-01-16 ENCOUNTER — Other Ambulatory Visit: Payer: Self-pay

## 2020-01-16 ENCOUNTER — Other Ambulatory Visit: Payer: Self-pay | Admitting: Surgery

## 2020-01-16 ENCOUNTER — Other Ambulatory Visit (HOSPITAL_COMMUNITY): Payer: Self-pay | Admitting: Surgery

## 2020-01-16 DIAGNOSIS — K611 Rectal abscess: Secondary | ICD-10-CM

## 2020-01-16 MED ORDER — IOHEXOL 300 MG/ML  SOLN
100.0000 mL | Freq: Once | INTRAMUSCULAR | Status: AC | PRN
  Administered 2020-01-16: 100 mL via INTRAVENOUS

## 2020-07-14 ENCOUNTER — Ambulatory Visit: Payer: Self-pay | Admitting: Surgery

## 2020-07-14 NOTE — H&P (Signed)
Joshua [name] Appointment: 07/14/2020 11:45 AM Location: [facility] Patient #: [id number] DOB: 10/27/1965 Married / Language: [place] / Race: White Male  History of Present Illness Joshua Sportsman MD; 07/14/2020 12:32 PM) The patient is a 55 year old male who presents with a complaint of anal problems. 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 Seemed to resolve and then had another abscess in September 2021 drained. Seemed to close up. However he had some pain and swelling in December. No definite abscesses but treated with oral antibiotics and he claims seen to resolve things. Then he had recurrent symptoms and more concerning abscess - came to urgent office where incision and drainage was done 2 weeks ago. Tried to pop it but still struggled. Concern for fistula. Recommended follow-up with me.  Patient notes that he usually moves his bowels after he dips and has coffee. Indication can be constipated. No fevers or chills or nausea. He helps teach truck drivers. No Crohn's or ulcerative colitis. Occasionally has loose bowel movements after his coffee. However not more than a couple a day.   PRIOR NOTE 2021: The patient returns to clinic. He had perianal pain and swelling requiring hospitalization and operative incision and drainage of an anterior horseshoe type perirectal abscess. Mainly right anterior. I did that in March. Follow-up in our office and seemed to resolve. However he got Covid in September. Had worsening bowel changes. Had pain and swelling. CT  scan showed recurrent abscess. Incision and drainage done in the office 5 weeks ago. Followed up & seem to be gradually healing.  Patient comes in today. He feels areas healed up. Denies any anal pain. He is pretty certain he is due for a follow-up colonoscopy. He recalls a male gastroenterologist in this building. Sounds like Dr. Marca Ancona with [facility]. Sounds like he had an episode of rectal bleeding with a hard bowel movement. Possible fissure versus inflamed hemorrhoid. No pain with bowel movements/dyschezia. Denies any rectal bleeding. No fevers or chills. He is moving his bowels every day. No bouts of constipation diarrhea. No bleeding. ` ###########################################   ` ###########################################`   06/28/2019  7:04 PM  PATIENT: Joshua [name] 55 y.o. male  Patient Care Team: Patient, No Pcp Per as PCP - General (General Practice)  PRE-OPERATIVE DIAGNOSIS: Perirectal abscess  POST-OPERATIVE DIAGNOSIS: Perirectal abscess  PROCEDURE: INCISION AND DEBRIDEMENT PERIRECTAL ABSCESS EXAM UNDER ANESTHESIA SURGEON: Joshua Sportsman, MD   ASSISTANT: OR Staff  ANESTHESIA: local and general  EBL: No intake/output data recorded.  Delay start of Pharmacological VTE agent (>24hrs) due to surgical blood loss or risk of bleeding: no  DRAINS: none  SPECIMEN: No Specimen  DISPOSITION OF SPECIMEN: N/A  COUNTS: YES  PLAN OF CARE: Admit for overnight observation  PATIENT DISPOSITION: PACU - hemodynamically stable.  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 Became unbearable. Came to emergency room. Peritoneal abscess suspected but seemed rather deep. Not amenable to bedside drainage. Surgical consultation requested. Recommended urgent examination under anesthesia with incision and drainage  The anatomy and physiology of skin abscesses was discussed. Pathophysiology of SQ abscess, possible  progression to fasciitis & sepsis, etc discussed . I  stressed good hygiene & wound care. Possible redebridement was discussed as well.  Possibility of recurrence was discussed. Risks, benefits, alternatives were discussed. I noted a good likelihood this will help address the problem. Risks of anesthesia and other risks discussed. Questions answered. The patient is does wish to proceed.  OR FINDINGS: Right anterior perirectal abscess with anterior horseshoe pattern carrying over anteriorly to left anterior aspect. Some continuation cephalad into the upper medial ischiorectal space as well as right posterior perirectal region.  Incisional wound 4 x 3 cm. Base of cavity goes 8 x 6 cm. It is 7 cm deep/cephalad  DESCRIPTION:  Informed consent was confirmed. The patient received IV antibiotics. The patient underwent general anesthesia without any difficulty. The patient was positioned in high lithotomy. SCDs were active during the entire case. The area around the abscess was prepped and draped in a sterile fashion. A surgical timeout confirmed our plan.  I did anorectal examination. Findings noted. I did an anorectal block using quarter percent bupivacaine with epinephrine mixed equally with liposomal bupivacaine/Exparel. I then used an 18-gauge needle through the most fluctuant area of the right anterior perirectal mass and encountered pus and deeply at the hub of the needle. I made a 3 cm radial incision in the right anterior aspect. Encountered some deeper pus. I probed more superiorly and up into the perineum and encountered the largest pus pocket. I could sweep my finger up into the upper medial issue rectal space as well as into the right posterior ischio rectal space. Broke up loculations with a good cavity.   I created some stellate incisions and enlarged the I&D wound to be 4 x 3 cm. It was somewhat oozy and inflamed. I packed the wound with half-inch ribbon Nu Gauze. Used a whole  bottle to help pack the region well for good hemostasis. Sterile dressings applied. Patient is being extubated go to recovery room. We plan to continue IV antibiotics and remove packing tomorrow. Hopefully leave in the morning if improved. Otherwise may need to stay for pain control. I discussed operative findings, updated the patient's status, discussed probable steps to recovery, and gave postoperative recommendations to the patient's spouse, Joshua [name]. Recommendations were made. Questions were answered. She expressed understanding & appreciation.     Joshua [name], M.D., F.A.C.S. Gastrointestinal and Minimally Invasive Surgery [facility] [address] [place], [place] [address] [phone] Main / Paging   Problem List/Past Medical Joshua Sportsman, MD; 07/14/2020 12:09 PM) WOUND CHECK, ABSCESS (Z51.89) PERIRECTAL ABSCESS (K61.1) PERIANAL PAIN (K62.89) Joshua [name] is a pleasant [phone] with hx of possible anterior horseshoe perianal abscess - I&D 06/2019 - here with some mild redness in perianal area and associated pain F/U PRN - HISTORY OF RECTAL SURGERY (Z98.890) PERINEAL ABSCESS (L02.215) HISTORY OF COVID-19 (Z86.16) [12/31/2019]:  Allergies Joshua Sportsman, MD; 07/14/2020 12:09 PM) Penicillin G Pot in Dextrose *PENICILLINS* Patient stated that when he was young, he has some type of reaction / LC Allergies Reconciled  Medication History Joshua Sportsman, MD; 07/14/2020 12:09 PM) Allopurinol (100MG  Tablet, Oral) Active. traZODone HCl (150MG  Tablet, Oral) Active. Statin (Pt does not know name) Active. Sleep Aid (Oral) Specific strength unknown - Active.  Social History , MD; 07/14/2020 12:09 PM) Alcohol use Occasional alcohol use. Caffeine use Coffee. No drug use Tobacco use Never smoker.  Family History Joshua Sportsman, MD; 07/14/2020 12:09 PM) Cancer Father. Diabetes Mellitus  Father.     Physical Exam (  Joshua SportsmanSteven C. Marikay Roads MD; 07/14/2020 12:17 PM)  General Mental Status-Alert. General Appearance-Not in acute distress. Voice-Normal.  Integumentary Global Assessment Upon inspection and palpation of skin surfaces of the - Distribution of scalp and body hair is normal. General Characteristics Overall examination of the patient's skin reveals - no rashes and no suspicious lesions.  Head and Neck Head-normocephalic, atraumatic with no lesions or palpable masses. Face Global Assessment - atraumatic, no absence of expression. Neck Global Assessment - no abnormal movements, no decreased range of motion. Trachea-midline. Thyroid Gland Characteristics - non-tender.  Eye Eyeball - Left-Extraocular movements intact, No Nystagmus - Left. Eyeball - Right-Extraocular movements intact, No Nystagmus - Right. Upper Eyelid - Left-No Cyanotic - Left. Upper Eyelid - Right-No Cyanotic - Right.  Chest and Lung Exam Inspection Accessory muscles - No use of accessory muscles in breathing.  Abdomen Note: Abdomen soft. Nontender nondistended  Male Genitourinary Note: No inguinal lymphadenopathy. Normal external genitalia  Rectal Note: Perianal skin clear. Left anterior midline perirectal wound about 2 cm from anal verge highly suspicious for fistula. I can feel a cord going to the anterior sphincter complex. Some mild pruritus. Some external hemorrhoidal disease. Normal sphincter tone. Held off internal exam.  Peripheral Vascular Upper Extremity Inspection - Left - Not Gangrenous, No Petechiae. Inspection - Right - Not Gangrenous, No Petechiae.  Neurologic Neurologic evaluation reveals -normal attention span and ability to concentrate, able to name objects and repeat phrases. Appropriate fund of knowledge and normal coordination.  Neuropsychiatric Mental status exam performed with findings of-able to articulate well with normal  speech/language, rate, volume and coherence and no evidence of hallucinations, delusions, obsessions or homicidal/suicidal ideation. Orientation-oriented X3.  Musculoskeletal Global Assessment Gait and Station - normal gait and station.  Lymphatic General Lymphatics Description - No Generalized lymphadenopathy.    Assessment & Plan Joshua [name](Joshua [name] C. 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  Recommended outpatient surgery. Examination under anesthesia. Probable LIFT repair versus fistulotomy.  I did caution he's been need to be out several weeks before considering driving again. Could be up to 6 weeks. We will see. He is frustrated/disappointed that he keeps getting infections and wishes to get the surgery done to break the cycle and finally heal this area  Current Plans Pt Education - [facility] Abscess/Fistula (AT): discussed with patient and provided information.  ENCOUNTER FOR PREOPERATIVE EXAMINATION FOR GENERAL SURGICAL PROCEDURE (Z01.818)  Current Plans You are being scheduled for surgery- Our schedulers will call you.  You should hear from our office's scheduling department within 5 working days about the location, date, and time of surgery. We try to make accommodations for patient's preferences in scheduling surgery, but sometimes the OR schedule or the surgeon's schedule prevents us from making those accommodations.  If you have not heard from our office [phone]) in 5 working days, call the office and ask for your surgeon's nurse.  If you have other questions about your diagnosis, plan, or surgery, call the office and ask for your surgeon's nurse.  The anatomy & physiology of the anorectal region was discussed. We discussed the pathophysiology of anorectal abscess and fistula. Differential diagnosis was discussed. Natural history progression was  discussed. I stressed the importance of a bowel regimen to have daily soft bowel movements to minimize progression of disease.  The patient's condition is not adequately controlled. Non-operative treatment has not healed the fistula. Therefore, I recommended examination under anaesthesia to  confirm the diagnosis and treat the fistula. I discussed techniques that may be required such as fistulotomy, ligation by LIFT technique, and/or seton placement. Benefits & alternatives discussed. I noted a good likelihood this will help address the problem, but sometimes repeat operations and prolonged healing times may occur. Risks such as bleeding, pain, recurrence, reoperation, incontinence, heart attack, death, and other risks were discussed.  Educational handouts further explaining the pathology, treatment options, and bowel regimen were given. The patient expressed understanding & wishes to proceed. We will work to coordinate surgery for a mutually convenient time.  Pt Education - [facility] Rectal Prep for Anorectal outpatient/office surgery: discussed with patient and provided information. Pt Education - [facility] Rectal Surgery HCI (Shaleen Talamantez): discussed with patient and provided information. Pt Education - [facility] Good Bowel Health (Jalee Saine)  HISTORY OF COVID-19 (E95.28) Story: History of Covid19 - August 2021  Joshua Sportsman, MD, FACS, MASCRS  Gastrointestinal and Minimally Invasive Surgery  [facility] [address], [address] [place], [place] [address] [phone] Fax [phone] Main/Paging  CONTACT INFORMATION: Weekday (9AM-5PM) concerns: Call [facility] main office at [phone] Weeknight (5PM-9AM) or Weekend/Holiday concerns: Check [email or web address] for General Surgery [facility] coverage (Please, do not use SecureChat as it is not reliable communication to operating surgeons for immediate patient care)

## 2020-08-15 ENCOUNTER — Other Ambulatory Visit: Payer: Self-pay

## 2020-08-15 ENCOUNTER — Encounter (HOSPITAL_BASED_OUTPATIENT_CLINIC_OR_DEPARTMENT_OTHER): Payer: Self-pay | Admitting: Surgery

## 2020-08-15 NOTE — Progress Notes (Signed)
Spoke w/ via phone for pre-op interview--- Pt Lab needs dos---- no              Lab results------ no COVID test ------ 08-18-2020 @ 1500 Arrive at -------  1200 on 08-21-2020 NPO after MN NO Solid Food (this includes dip tobacco).  Clear liquids from MN until--- 1100 Med rec completed Medications to take morning of surgery ----- NONE Diabetic medication ----- n/a Patient instructed to bring photo id and insurance card day of surgery Patient aware to have Driver (ride ) / caregiver    for 24 hours after surgery -- wife, [place] Patient Special Instructions ----- n/a Pre-Op special Istructions -----  Pt will be picking up ensure pre-surgery drink Monday 08-18-2020 @ [facility] front desk with handout instructions Patient verbalized understanding of instructions that were given at this phone interview. Patient denies shortness of breath, chest pain, fever, cough at this phone interview.

## 2020-08-18 ENCOUNTER — Other Ambulatory Visit (HOSPITAL_COMMUNITY)
Admission: RE | Admit: 2020-08-18 | Discharge: 2020-08-18 | Disposition: A | Discharge status: Home / Self Care | Payer: 59 | Source: Ambulatory Visit | Attending: Surgery | Admitting: Surgery

## 2020-08-18 DIAGNOSIS — Z20822 Contact with and (suspected) exposure to covid-19: Secondary | ICD-10-CM | POA: Insufficient documentation

## 2020-08-18 DIAGNOSIS — Z01812 Encounter for preprocedural laboratory examination: Secondary | ICD-10-CM | POA: Insufficient documentation

## 2020-08-19 LAB — SARS CORONAVIRUS 2 (TAT 6-24 HRS): SARS Coronavirus 2: NEGATIVE

## 2020-08-20 NOTE — Anesthesia Preprocedure Evaluation (Addendum)
Anesthesia Evaluation  Patient identified by MRN, date of birth, ID band Patient awake    Reviewed: Allergy & Precautions, NPO status , Patient's Chart, lab work & pertinent test results  History of Anesthesia Complications Negative for: history of anesthetic complications  Airway Mallampati: II  TM Distance: >3 FB Neck ROM: Full    Dental  (+)    Pulmonary neg pulmonary ROS,    Pulmonary exam normal        Cardiovascular negative cardio ROS Normal cardiovascular exam     Neuro/Psych RLS negative psych ROS   GI/Hepatic Neg liver ROS, Anal fistula   Endo/Other  BMI 36  Renal/GU negative Renal ROS  negative genitourinary   Musculoskeletal gout   Abdominal   Peds  Hematology negative hematology ROS (+)   Anesthesia Other Findings Day of surgery medications reviewed with patient.  Reproductive/Obstetrics negative OB ROS                            Anesthesia Physical Anesthesia Plan  ASA: II  Anesthesia Plan: General   Post-op Pain Management:    Induction: Intravenous  PONV Risk Score and Plan: 2 and Treatment may vary due to age or medical condition, Ondansetron, Dexamethasone and Midazolam  Airway Management Planned: Oral ETT  Additional Equipment: None  Intra-op Plan:   Post-operative Plan: Extubation in OR  Informed Consent: I have reviewed the patients History and Physical, chart, labs and discussed the procedure including the risks, benefits and alternatives for the proposed anesthesia with the patient or authorized representative who has indicated his/her understanding and acceptance.     Dental advisory given  Plan Discussed with: CRNA  Anesthesia Plan Comments:        Anesthesia Quick Evaluation

## 2020-08-20 NOTE — Progress Notes (Signed)
Talked with patient to verify time change from 1200 to 1030

## 2020-08-21 ENCOUNTER — Encounter (HOSPITAL_BASED_OUTPATIENT_CLINIC_OR_DEPARTMENT_OTHER): Payer: Self-pay | Admitting: Surgery

## 2020-08-21 ENCOUNTER — Ambulatory Visit (HOSPITAL_BASED_OUTPATIENT_CLINIC_OR_DEPARTMENT_OTHER): Payer: 59 | Admitting: Anesthesiology

## 2020-08-21 ENCOUNTER — Ambulatory Visit (HOSPITAL_BASED_OUTPATIENT_CLINIC_OR_DEPARTMENT_OTHER)
Admission: RE | Admit: 2020-08-21 | Discharge: 2020-08-21 | Disposition: A | Discharge status: Home / Self Care | Payer: 59 | Attending: Surgery | Admitting: Surgery

## 2020-08-21 ENCOUNTER — Encounter (HOSPITAL_BASED_OUTPATIENT_CLINIC_OR_DEPARTMENT_OTHER)
Admission: RE | Disposition: A | Discharge status: Home / Self Care | Payer: Self-pay | Source: Home / Self Care | Attending: Surgery

## 2020-08-21 ENCOUNTER — Other Ambulatory Visit: Payer: Self-pay

## 2020-08-21 DIAGNOSIS — J329 Chronic sinusitis, unspecified: Secondary | ICD-10-CM | POA: Insufficient documentation

## 2020-08-21 DIAGNOSIS — Z809 Family history of malignant neoplasm, unspecified: Secondary | ICD-10-CM | POA: Insufficient documentation

## 2020-08-21 DIAGNOSIS — K648 Other hemorrhoids: Secondary | ICD-10-CM | POA: Insufficient documentation

## 2020-08-21 DIAGNOSIS — K642 Third degree hemorrhoids: Secondary | ICD-10-CM

## 2020-08-21 DIAGNOSIS — E291 Testicular hypofunction: Secondary | ICD-10-CM | POA: Insufficient documentation

## 2020-08-21 DIAGNOSIS — M109 Gout, unspecified: Secondary | ICD-10-CM | POA: Insufficient documentation

## 2020-08-21 DIAGNOSIS — Z8616 Personal history of COVID-19: Secondary | ICD-10-CM | POA: Insufficient documentation

## 2020-08-21 DIAGNOSIS — E669 Obesity, unspecified: Secondary | ICD-10-CM | POA: Insufficient documentation

## 2020-08-21 DIAGNOSIS — F419 Anxiety disorder, unspecified: Secondary | ICD-10-CM

## 2020-08-21 DIAGNOSIS — Z880 Allergy status to penicillin: Secondary | ICD-10-CM | POA: Insufficient documentation

## 2020-08-21 DIAGNOSIS — K605 Anorectal fistula: Secondary | ICD-10-CM | POA: Diagnosis present

## 2020-08-21 DIAGNOSIS — K2100 Gastro-esophageal reflux disease with esophagitis, without bleeding: Secondary | ICD-10-CM | POA: Insufficient documentation

## 2020-08-21 DIAGNOSIS — I700 Atherosclerosis of aorta: Secondary | ICD-10-CM | POA: Insufficient documentation

## 2020-08-21 DIAGNOSIS — G4700 Insomnia, unspecified: Secondary | ICD-10-CM | POA: Insufficient documentation

## 2020-08-21 DIAGNOSIS — F439 Reaction to severe stress, unspecified: Secondary | ICD-10-CM | POA: Insufficient documentation

## 2020-08-21 DIAGNOSIS — K603 Anal fistula, unspecified: Secondary | ICD-10-CM

## 2020-08-21 HISTORY — DX: Anal fistula: K60.3

## 2020-08-21 HISTORY — DX: Personal history of other diseases of the musculoskeletal system and connective tissue: Z87.39

## 2020-08-21 HISTORY — DX: Anal fistula, unspecified: K60.30

## 2020-08-21 HISTORY — DX: Testicular hypofunction: E29.1

## 2020-08-21 HISTORY — PX: EVALUATION UNDER ANESTHESIA WITH HEMORRHOIDECTOMY: SHX5624

## 2020-08-21 HISTORY — DX: Restless legs syndrome: G25.81

## 2020-08-21 HISTORY — DX: Plantar fascial fibromatosis: M72.2

## 2020-08-21 LAB — POCT I-STAT, CHEM 8
BUN: 14 mg/dL (ref 6–20)
Calcium, Ion: 1.22 mmol/L (ref 1.15–1.40)
Chloride: 102 mmol/L (ref 98–111)
Creatinine, Ser: 1.2 mg/dL (ref 0.61–1.24)
Glucose, Bld: 78 mg/dL (ref 70–99)
HCT: 51 % (ref 39.0–52.0)
Hemoglobin: 17.3 g/dL — ABNORMAL HIGH (ref 13.0–17.0)
Potassium: 3.8 mmol/L (ref 3.5–5.1)
Sodium: 138 mmol/L (ref 135–145)
TCO2: 24 mmol/L (ref 22–32)

## 2020-08-21 SURGERY — EXAM UNDER ANESTHESIA WITH HEMORRHOIDECTOMY
Anesthesia: General | Site: Rectum

## 2020-08-21 MED ORDER — ROCURONIUM BROMIDE 100 MG/10ML IV SOLN
INTRAVENOUS | Status: DC | PRN
  Administered 2020-08-21: 10 mg via INTRAVENOUS
  Administered 2020-08-21: 60 mg via INTRAVENOUS

## 2020-08-21 MED ORDER — FENTANYL CITRATE (PF) 100 MCG/2ML IJ SOLN
INTRAMUSCULAR | Status: AC
  Filled 2020-08-21: qty 2

## 2020-08-21 MED ORDER — BUPIVACAINE LIPOSOME 1.3 % IJ SUSP: 20.0000 mL | Freq: Once | INTRAMUSCULAR | Status: DC

## 2020-08-21 MED ORDER — OXYCODONE HCL 5 MG PO TABS: 5.0000 mg | ORAL_TABLET | Freq: Four times a day (QID) | ORAL | 0 refills | Status: DC | PRN

## 2020-08-21 MED ORDER — CHLORHEXIDINE GLUCONATE CLOTH 2 % EX PADS: 6.0000 | MEDICATED_PAD | Freq: Once | CUTANEOUS | Status: DC

## 2020-08-21 MED ORDER — ACETAMINOPHEN 500 MG PO TABS
ORAL_TABLET | ORAL | Status: AC
  Filled 2020-08-21: qty 2

## 2020-08-21 MED ORDER — LACTATED RINGERS IV SOLN: INTRAVENOUS | Status: DC

## 2020-08-21 MED ORDER — ROCURONIUM BROMIDE 10 MG/ML (PF) SYRINGE
PREFILLED_SYRINGE | INTRAVENOUS | Status: AC
  Filled 2020-08-21: qty 10

## 2020-08-21 MED ORDER — OXYCODONE HCL 5 MG PO TABS
ORAL_TABLET | ORAL | Status: AC
  Filled 2020-08-21: qty 1

## 2020-08-21 MED ORDER — DEXAMETHASONE SODIUM PHOSPHATE 10 MG/ML IJ SOLN
INTRAMUSCULAR | Status: AC
  Filled 2020-08-21: qty 1

## 2020-08-21 MED ORDER — LIDOCAINE 2% (20 MG/ML) 5 ML SYRINGE
INTRAMUSCULAR | Status: AC
  Filled 2020-08-21: qty 5

## 2020-08-21 MED ORDER — GABAPENTIN 300 MG PO CAPS
300.0000 mg | ORAL_CAPSULE | ORAL | Status: AC
  Administered 2020-08-21: 300 mg via ORAL

## 2020-08-21 MED ORDER — CELECOXIB 200 MG PO CAPS
200.0000 mg | ORAL_CAPSULE | ORAL | Status: AC
  Administered 2020-08-21: 200 mg via ORAL

## 2020-08-21 MED ORDER — PROMETHAZINE HCL 25 MG/ML IJ SOLN
6.2500 mg | INTRAMUSCULAR | Status: DC | PRN
  Administered 2020-08-21: 6.25 mg via INTRAVENOUS

## 2020-08-21 MED ORDER — ACETAMINOPHEN 500 MG PO TABS
1000.0000 mg | ORAL_TABLET | ORAL | Status: AC
  Administered 2020-08-21: 1000 mg via ORAL

## 2020-08-21 MED ORDER — CELECOXIB 200 MG PO CAPS
ORAL_CAPSULE | ORAL | Status: AC
  Filled 2020-08-21: qty 1

## 2020-08-21 MED ORDER — OXYCODONE HCL 5 MG/5ML PO SOLN: 5.0000 mg | Freq: Once | ORAL | Status: AC | PRN

## 2020-08-21 MED ORDER — ACETAMINOPHEN 500 MG PO TABS: 1000.0000 mg | ORAL_TABLET | Freq: Once | ORAL | Status: DC

## 2020-08-21 MED ORDER — MIDAZOLAM HCL 5 MG/5ML IJ SOLN
INTRAMUSCULAR | Status: DC | PRN
  Administered 2020-08-21: 2 mg via INTRAVENOUS

## 2020-08-21 MED ORDER — BUPIVACAINE-EPINEPHRINE 0.25% -1:200000 IJ SOLN
INTRAMUSCULAR | Status: DC | PRN
  Administered 2020-08-21: 20 mL

## 2020-08-21 MED ORDER — PROPOFOL 10 MG/ML IV BOLUS
INTRAVENOUS | Status: DC | PRN
  Administered 2020-08-21: 200 mg via INTRAVENOUS
  Administered 2020-08-21 (×2): 50 mg via INTRAVENOUS
  Administered 2020-08-21: 100 mg via INTRAVENOUS

## 2020-08-21 MED ORDER — METHYLENE BLUE 0.5 % INJ SOLN
INTRAVENOUS | Status: DC | PRN
  Administered 2020-08-21: 2.5 mL

## 2020-08-21 MED ORDER — OXYCODONE HCL 5 MG PO TABS
5.0000 mg | ORAL_TABLET | Freq: Once | ORAL | Status: AC | PRN
  Administered 2020-08-21: 5 mg via ORAL

## 2020-08-21 MED ORDER — 0.9 % SODIUM CHLORIDE (POUR BTL) OPTIME
TOPICAL | Status: DC | PRN
  Administered 2020-08-21: 500 mL

## 2020-08-21 MED ORDER — LIDOCAINE HCL (CARDIAC) PF 100 MG/5ML IV SOSY
PREFILLED_SYRINGE | INTRAVENOUS | Status: DC | PRN
  Administered 2020-08-21: 100 mg via INTRAVENOUS

## 2020-08-21 MED ORDER — DIBUCAINE (PERIANAL) 1 % EX OINT
TOPICAL_OINTMENT | CUTANEOUS | Status: DC | PRN
  Administered 2020-08-21: 1 via RECTAL

## 2020-08-21 MED ORDER — GENTAMICIN SULFATE 40 MG/ML IJ SOLN
5.0000 mg/kg | INTRAVENOUS | Status: AC
  Administered 2020-08-21: 446 mg via INTRAVENOUS
  Filled 2020-08-21: qty 11.25

## 2020-08-21 MED ORDER — DIAZEPAM 5 MG PO TABS: 5.0000 mg | ORAL_TABLET | Freq: Four times a day (QID) | ORAL | Status: DC | PRN

## 2020-08-21 MED ORDER — CLINDAMYCIN PHOSPHATE 900 MG/50ML IV SOLN
INTRAVENOUS | Status: AC
  Filled 2020-08-21: qty 50

## 2020-08-21 MED ORDER — ENSURE PRE-SURGERY PO LIQD: 296.0000 mL | Freq: Once | ORAL | Status: DC

## 2020-08-21 MED ORDER — SUGAMMADEX SODIUM 200 MG/2ML IV SOLN
INTRAVENOUS | Status: DC | PRN
  Administered 2020-08-21: 200 mg via INTRAVENOUS

## 2020-08-21 MED ORDER — CLINDAMYCIN PHOSPHATE 900 MG/50ML IV SOLN
900.0000 mg | INTRAVENOUS | Status: AC
  Administered 2020-08-21: 900 mg via INTRAVENOUS

## 2020-08-21 MED ORDER — DIAZEPAM 5 MG PO TABS: 5.0000 mg | ORAL_TABLET | Freq: Four times a day (QID) | ORAL | 2 refills | Status: DC | PRN

## 2020-08-21 MED ORDER — DEXAMETHASONE SODIUM PHOSPHATE 4 MG/ML IJ SOLN
INTRAMUSCULAR | Status: DC | PRN
  Administered 2020-08-21: 10 mg via INTRAVENOUS

## 2020-08-21 MED ORDER — PROMETHAZINE HCL 25 MG/ML IJ SOLN
INTRAMUSCULAR | Status: AC
  Filled 2020-08-21: qty 1

## 2020-08-21 MED ORDER — BUPIVACAINE LIPOSOME 1.3 % IJ SUSP
INTRAMUSCULAR | Status: DC | PRN
  Administered 2020-08-21: 20 mL

## 2020-08-21 MED ORDER — ONDANSETRON HCL 4 MG/2ML IJ SOLN
INTRAMUSCULAR | Status: AC
  Filled 2020-08-21: qty 2

## 2020-08-21 MED ORDER — MIDAZOLAM HCL 2 MG/2ML IJ SOLN
INTRAMUSCULAR | Status: AC
  Filled 2020-08-21: qty 2

## 2020-08-21 MED ORDER — FENTANYL CITRATE (PF) 100 MCG/2ML IJ SOLN
25.0000 ug | INTRAMUSCULAR | Status: DC | PRN
  Administered 2020-08-21 (×2): 25 ug via INTRAVENOUS

## 2020-08-21 MED ORDER — ONDANSETRON HCL 4 MG/2ML IJ SOLN
INTRAMUSCULAR | Status: DC | PRN
  Administered 2020-08-21: 4 mg via INTRAVENOUS

## 2020-08-21 MED ORDER — FENTANYL CITRATE (PF) 100 MCG/2ML IJ SOLN
INTRAMUSCULAR | Status: DC | PRN
  Administered 2020-08-21 (×2): 100 ug via INTRAVENOUS
  Administered 2020-08-21 (×2): 50 ug via INTRAVENOUS

## 2020-08-21 MED ORDER — GABAPENTIN 300 MG PO CAPS
ORAL_CAPSULE | ORAL | Status: AC
  Filled 2020-08-21: qty 1

## 2020-08-21 MED ORDER — PROPOFOL 10 MG/ML IV BOLUS
INTRAVENOUS | Status: AC
  Filled 2020-08-21: qty 20

## 2020-08-21 SURGICAL SUPPLY — 62 items
APL SKNCLS STERI-STRIP NONHPOA (GAUZE/BANDAGES/DRESSINGS) ×1
BENZOIN TINCTURE PRP APPL 2/3 (GAUZE/BANDAGES/DRESSINGS) ×2 IMPLANT
BLADE HEX COATED 2.75 (ELECTRODE) ×2 IMPLANT
BLADE SURG 10 STRL SS (BLADE) IMPLANT
BLADE SURG 15 STRL LF DISP TIS (BLADE) ×1 IMPLANT
BLADE SURG 15 STRL SS (BLADE) ×2
BNDG GAUZE ELAST 4 BULKY (GAUZE/BANDAGES/DRESSINGS) ×1 IMPLANT
BRIEF STRETCH FOR OB PAD LRG (UNDERPADS AND DIAPERS) ×2 IMPLANT
CANISTER SUCT 1200ML W/VALVE (MISCELLANEOUS) ×2 IMPLANT
COVER BACK TABLE 60X90IN (DRAPES) ×2 IMPLANT
COVER MAYO STAND STRL (DRAPES) ×2 IMPLANT
COVER WAND RF STERILE (DRAPES) ×2 IMPLANT
DECANTER SPIKE VIAL GLASS SM (MISCELLANEOUS) ×2 IMPLANT
DRAPE HYSTEROSCOPY (MISCELLANEOUS) IMPLANT
DRAPE LAPAROTOMY 100X72 PEDS (DRAPES) ×2 IMPLANT
DRAPE SHEET LG 3/4 BI-LAMINATE (DRAPES) IMPLANT
DRSG PAD ABDOMINAL 8X10 ST (GAUZE/BANDAGES/DRESSINGS) ×2 IMPLANT
ELECT NDL TIP 2.8 STRL (NEEDLE) IMPLANT
ELECT NEEDLE TIP 2.8 STRL (NEEDLE) IMPLANT
ELECT REM PT RETURN 9FT ADLT (ELECTROSURGICAL) ×2
ELECTRODE REM PT RTRN 9FT ADLT (ELECTROSURGICAL) ×1 IMPLANT
FILTER STRAW (MISCELLANEOUS) ×2 IMPLANT
GAUZE SPONGE 4X4 12PLY STRL LF (GAUZE/BANDAGES/DRESSINGS) ×1 IMPLANT
GLOVE SURG LTX SZ8 (GLOVE) ×2 IMPLANT
GLOVE SURG UNDER LTX SZ8 (GLOVE) ×2 IMPLANT
GOWN STRL REUS W/TWL XL LVL3 (GOWN DISPOSABLE) ×2 IMPLANT
IV CATH 18G SAFETY (IV SOLUTION) ×1 IMPLANT
IV CATH PLACEMENT 20 GA (IV SOLUTION) ×2 IMPLANT
KIT SIGMOIDOSCOPE (SET/KITS/TRAYS/PACK) IMPLANT
KIT TURNOVER CYSTO (KITS) ×2 IMPLANT
LEGGING LITHOTOMY PAIR STRL (DRAPES) IMPLANT
NDL FILTER BLUNT 18X1 1/2 (NEEDLE) IMPLANT
NDL HYPO 25X1 1.5 SAFETY (NEEDLE) IMPLANT
NEEDLE FILTER BLUNT 18X 1/2SAF (NEEDLE) ×1
NEEDLE FILTER BLUNT 18X1 1/2 (NEEDLE) ×1 IMPLANT
NEEDLE HYPO 22GX1.5 SAFETY (NEEDLE) ×3 IMPLANT
NEEDLE HYPO 25X1 1.5 SAFETY (NEEDLE) ×2 IMPLANT
NS IRRIG 500ML POUR BTL (IV SOLUTION) ×2 IMPLANT
PACK BASIN DAY SURGERY FS (CUSTOM PROCEDURE TRAY) ×2 IMPLANT
PAD PREP 24X48 CUFFED NSTRL (MISCELLANEOUS) IMPLANT
PENCIL SMOKE EVACUATOR (MISCELLANEOUS) ×2 IMPLANT
SCRUB TECHNI CARE 4 OZ NO DYE (MISCELLANEOUS) ×2 IMPLANT
SHEARS HARMONIC 9CM CVD (BLADE) IMPLANT
SURGILUBE 2OZ TUBE FLIPTOP (MISCELLANEOUS) ×2 IMPLANT
SUT CHROMIC 2 0 SH (SUTURE) IMPLANT
SUT CHROMIC 3 0 SH 27 (SUTURE) IMPLANT
SUT VIC AB 2-0 SH 27 (SUTURE)
SUT VIC AB 2-0 SH 27XBRD (SUTURE) IMPLANT
SUT VIC AB 2-0 UR6 27 (SUTURE) ×8 IMPLANT
SUT VICRYL 0 UR6 27IN ABS (SUTURE) IMPLANT
SUT VICRYL AB 2 0 TIE (SUTURE) IMPLANT
SUT VICRYL AB 2 0 TIES (SUTURE) ×2
SYR 20ML LL LF (SYRINGE) ×3 IMPLANT
SYR 27GX1/2 1ML LL SAFETY (SYRINGE) ×2 IMPLANT
SYR BULB IRRIG 60ML STRL (SYRINGE) ×2 IMPLANT
SYR CONTROL 10ML LL (SYRINGE) ×1 IMPLANT
TAPE CLOTH 3X10 TAN LF (GAUZE/BANDAGES/DRESSINGS) ×2 IMPLANT
TOWEL OR 17X26 10 PK STRL BLUE (TOWEL DISPOSABLE) ×2 IMPLANT
TRAY DSU PREP LF (CUSTOM PROCEDURE TRAY) ×2 IMPLANT
TUBE CONNECTING 12X1/4 (SUCTIONS) ×2 IMPLANT
UNDERPAD 30X36 HEAVY ABSORB (UNDERPADS AND DIAPERS) ×2 IMPLANT
YANKAUER SUCT BULB TIP NO VENT (SUCTIONS) ×2 IMPLANT

## 2020-08-21 NOTE — H&P (Addendum)
Joshua [name] DOB: 06/30/1965 Married / Language: English / Race: White Male  Patient Care Team: Joycelyn RuaMeyers, Stephen, MD as PCP - General (Family Medicine) Karie SodaGross, Marelly Wehrman, MD as Consulting Physician (General Surgery) Kerin SalenKarki, Arya, MD as Consulting Physician (Gastroenterology)  ` The patient returns s/p -incision and drainage of anterior horseshoe perirectal abscess mainly right-sided in OR 06/28/2019 -COVID infection 12/31/2019 -office I&D 01/17/2020 anterior perirectal abscess -Oral antibiotics for perineal inflammation December 2021 -Incision and drainage for recurrent perirectal abscess 06/27/2020    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 Seemed to resolve and then had another abscess in September 2021 drained. Seemed to close up. However he had some pain and swelling in December. No definite abscesses but treated with oral antibiotics and he claims seen to resolve things. Then he had recurrent symptoms and more concerning abscess - came to urgent office where incision and drainage was done 2 weeks ago. Tried to pop it but still struggled. Concern for fistula. Recommended follow-up with me.  Patient notes that he usually moves his bowels after he dips and has coffee. Indication can be constipated. No fevers or chills or nausea. He helps teach truck drivers. No Crohn's or ulcerative colitis. Occasionally has loose bowel movements after his coffee. However not more than a couple a day.   PRIOR NOTE 2021: The patient returns to clinic. He had perianal pain and swelling requiring hospitalization and operative incision and drainage of an anterior horseshoe type perirectal abscess. Mainly right anterior. I did that in March. Follow-up in our office and seemed to resolve. However he got Covid in September. Had worsening bowel changes. Had pain and swelling. CT scan showed recurrent abscess. Incision and drainage done in the  office 5 weeks ago. Followed up & seem to be gradually healing.  Patient comes in today. He feels areas healed up. Denies any anal pain. He is pretty certain he is due for a follow-up colonoscopy. He recalls a male gastroenterologist in this building. Sounds like Dr. Marca AnconaKarki with [facility]. Sounds like he had an episode of rectal bleeding with a hard bowel movement. Possible fissure versus inflamed hemorrhoid. No pain with bowel movements/dyschezia. Denies any rectal bleeding. No fevers or chills. He is moving his bowels every day. No bouts of constipation diarrhea. No bleeding. ` ###########################################   ` ###########################################`   06/28/2019  7:04 PM  PATIENT: Joshua [name] 55 y.o. male  Patient Care Team: Patient, No Pcp Per as PCP - General (General Practice)  PRE-OPERATIVE DIAGNOSIS: Perirectal abscess  POST-OPERATIVE DIAGNOSIS: Perirectal abscess  PROCEDURE: INCISION AND DEBRIDEMENT PERIRECTAL ABSCESS EXAM UNDER ANESTHESIA SURGEON: Ardeth SportsmanSteven C. Nella Botsford, MD   ASSISTANT: OR Staff  ANESTHESIA: local and general  EBL: No intake/output data recorded.  Delay start of Pharmacological VTE agent (>24hrs) due to surgical blood loss or risk of bleeding: no  DRAINS: none  SPECIMEN: No Specimen  DISPOSITION OF SPECIMEN: N/A  COUNTS: YES  PLAN OF CARE: Admit for overnight observation  PATIENT DISPOSITION: PACU - hemodynamically stable.  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 Became unbearable. Came to emergency room. Peritoneal abscess suspected but seemed rather deep. Not amenable to bedside drainage. Surgical consultation requested. Recommended urgent examination under anesthesia with incision and drainage  The anatomy and physiology of skin abscesses was discussed. Pathophysiology of SQ abscess, possible progression to fasciitis & sepsis, etc  discussed . I stressed good hygiene & wound care. Possible redebridement was discussed as well.  Possibility of recurrence  was discussed. Risks, benefits, alternatives were discussed. I noted a good likelihood this will help address the problem. Risks of anesthesia and other risks discussed. Questions answered. The patient is does wish to proceed.  OR FINDINGS: Right anterior perirectal abscess with anterior horseshoe pattern carrying over anteriorly to left anterior aspect. Some continuation cephalad into the upper medial ischiorectal space as well as right posterior perirectal region.  Incisional wound 4 x 3 cm. Base of cavity goes 8 x 6 cm. It is 7 cm deep/cephalad  DESCRIPTION:  Informed consent was confirmed. The patient received IV antibiotics. The patient underwent general anesthesia without any difficulty. The patient was positioned in high lithotomy. SCDs were active during the entire case. The area around the abscess was prepped and draped in a sterile fashion. A surgical timeout confirmed our plan.  I did anorectal examination. Findings noted. I did an anorectal block using quarter percent bupivacaine with epinephrine mixed equally with liposomal bupivacaine/Exparel. I then used an 18-gauge needle through the most fluctuant area of the right anterior perirectal mass and encountered pus and deeply at the hub of the needle. I made a 3 cm radial incision in the right anterior aspect. Encountered some deeper pus. I probed more superiorly and up into the perineum and encountered the largest pus pocket. I could sweep my finger up into the upper medial issue rectal space as well as into the right posterior ischio rectal space. Broke up loculations with a good cavity.   I created some stellate incisions and enlarged the I&D wound to be 4 x 3 cm. It was somewhat oozy and inflamed. I packed the wound with half-inch ribbon Nu Gauze. Used a whole bottle to help pack the region well  for good hemostasis. Sterile dressings applied. Patient is being extubated go to recovery room. We plan to continue IV antibiotics and remove packing tomorrow. Hopefully leave in the morning if improved. Otherwise may need to stay for pain control. I discussed operative findings, updated the patient's status, discussed probable steps to recovery, and gave postoperative recommendations to the patient's spouse, Joshua [name]. Recommendations were made. Questions were answered. She expressed understanding & appreciation.     Ardeth Sportsman, M.D., F.A.C.S. Gastrointestinal and Minimally Invasive Surgery [facility] [address], [address] [place], [place] [address] [phone] Main / Paging   Problem List/Past Medical Ardeth Sportsman, MD; 07/14/2020 12:09 PM) WOUND CHECK, ABSCESS (Z51.89) PERIRECTAL ABSCESS (K61.1) PERIANAL PAIN (K62.89) Joshua [name] is a pleasant [phone] with hx of possible anterior horseshoe perianal abscess - I&D 06/2019 - here with some mild redness in perianal area and associated pain F/U PRN - HISTORY OF RECTAL SURGERY (Z98.890) PERINEAL ABSCESS (L02.215) HISTORY OF COVID-19 (Z86.16) [12/31/2019]:  Allergies Ardeth Sportsman, MD; 07/14/2020 12:09 PM) Penicillin G Pot in Dextrose *PENICILLINS* Patient stated that when he was young, he has some type of reaction / LC Allergies Reconciled  Medication History Ardeth Sportsman, MD; 07/14/2020 12:09 PM) Allopurinol (100MG  Tablet, Oral) Active. traZODone HCl (150MG  Tablet, Oral) Active. Statin (Pt does not know name) Active. Sleep Aid (Oral) Specific strength unknown - Active.  Social History , MD; 07/14/2020 12:09 PM) Alcohol use Occasional alcohol use. Caffeine use Coffee. No drug use Tobacco use Never smoker.  Family History Ardeth Sportsman, MD; 07/14/2020 12:09 PM) Cancer Father. Diabetes Mellitus Father.     Physical Exam  Ardeth Sportsman MD; 07/14/2020 12:17 PM)  General Mental Status-Alert. General Appearance-Not in acute distress.  Voice-Normal.  Integumentary Global Assessment Upon inspection and palpation of skin surfaces of the - Distribution of scalp and body hair is normal. General Characteristics Overall examination of the patient's skin reveals - no rashes and no suspicious lesions.  Head and Neck Head-normocephalic, atraumatic with no lesions or palpable masses. Face Global Assessment - atraumatic, no absence of expression. Neck Global Assessment - no abnormal movements, no decreased range of motion. Trachea-midline. Thyroid Gland Characteristics - non-tender.  Eye Eyeball - Left-Extraocular movements intact, No Nystagmus - Left. Eyeball - Right-Extraocular movements intact, No Nystagmus - Right. Upper Eyelid - Left-No Cyanotic - Left. Upper Eyelid - Right-No Cyanotic - Right.  Chest and Lung Exam Inspection Accessory muscles - No use of accessory muscles in breathing.  Abdomen Note: Abdomen soft. Nontender nondistended  Male Genitourinary Note: No inguinal lymphadenopathy. Normal external genitalia  Rectal Note: Perianal skin clear. Left anterior midline perirectal wound about 2 cm from anal verge highly suspicious for fistula. I can feel a cord going to the anterior sphincter complex. Some mild pruritus. Some external hemorrhoidal disease. Normal sphincter tone. Held off internal exam.  Peripheral Vascular Upper Extremity Inspection - Left - Not Gangrenous, No Petechiae. Inspection - Right - Not Gangrenous, No Petechiae.  Neurologic Neurologic evaluation reveals -normal attention span and ability to concentrate, able to name objects and repeat phrases. Appropriate fund of knowledge and normal coordination.  Neuropsychiatric Mental status exam performed with findings of-able to articulate well with normal speech/language, rate, volume  and coherence and no evidence of hallucinations, delusions, obsessions or homicidal/suicidal ideation. Orientation-oriented X3.  Musculoskeletal Global Assessment Gait and Station - normal gait and station.  Lymphatic General Lymphatics Description - No Generalized lymphadenopathy.    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  Recommended outpatient surgery. Examination under anesthesia. Probable LIFT repair versus fistulotomy.  I did caution he's been need to be out several weeks before considering driving again. Could be up to 6 weeks. We will see. He is frustrated/disappointed that he keeps getting infections and wishes to get the surgery done to break the cycle and finally heal this area  Current Plans Pt Education - [facility] Abscess/Fistula (AT): discussed with patient and provided information.  ENCOUNTER FOR PREOPERATIVE EXAMINATION FOR GENERAL SURGICAL PROCEDURE (Z01.818)  Current Plans You are being scheduled for surgery- Our schedulers will call you.  You should hear from our office's scheduling department within 5 working days about the location, date, and time of surgery. We try to make accommodations for patient's preferences in scheduling surgery, but sometimes the OR schedule or the surgeon's schedule prevents [place] from making those accommodations.  If you have not heard from our office [phone]) in 5 working days, call the office and ask for your surgeon's nurse.  If you have other questions about your diagnosis, plan, or surgery, call the office and ask for your surgeon's nurse.  The anatomy & physiology of the anorectal region was discussed. We discussed the pathophysiology of anorectal abscess and fistula. Differential diagnosis was discussed. Natural history progression was discussed. I  stressed the importance of a bowel regimen to have daily soft bowel movements to minimize progression of disease.  The patient's condition is not adequately controlled. Non-operative treatment has not healed the fistula. Therefore, I recommended examination under anaesthesia to confirm the diagnosis and treat the fistula. I discussed techniques that may be required such as  fistulotomy, ligation by LIFT technique, and/or seton placement. Benefits & alternatives discussed. I noted a good likelihood this will help address the problem, but sometimes repeat operations and prolonged healing times may occur. Risks such as bleeding, pain, recurrence, reoperation, incontinence, heart attack, death, and other risks were discussed.  Educational handouts further explaining the pathology, treatment options, and bowel regimen were given. The patient expressed understanding & wishes to proceed. We will work to coordinate surgery for a mutually convenient time.  Pt Education - [facility] Rectal Prep for Anorectal outpatient/office surgery: discussed with patient and provided information. Pt Education - [facility] Rectal Surgery HCI (Rashonda Warrior): discussed with patient and provided information. Pt Education - [facility] Good Bowel Health (Damoni Causby)  HISTORY OF COVID-19 (Z61.09) Story: History of Covid19 - August 2021  Ardeth Sportsman, MD, FACS, MASCRS  Gastrointestinal and Minimally Invasive Surgery  [facility] [address] [place], [place] [address] [phone] Fax [phone] Main/Paging  CONTACT INFORMATION: Weekday (9AM-5PM) concerns: Call [facility] main office at [phone] Weeknight (5PM-9AM) or Weekend/Holiday concerns: Check [email or web address] for General Surgery [facility] coverage (Please, do not use SecureChat as it is not reliable communication to operating surgeons for immediate patient care)     I have re-reviewed the the patient's records, history, medications, and  allergies.  I have re-examined the patient.  I again discussed intraoperative plans and goals of post-operative recovery.  The patient agrees to proceed.  Joshua [name]  [birth year] [id number]  Patient Care Team: Joycelyn Rua, MD as PCP - General (Family Medicine) Karie Soda, MD as Consulting Physician (General Surgery) Kerin Salen, MD as Consulting Physician (Gastroenterology)  Patient Active Problem List   Diagnosis Date Noted  . Anxiety 08/21/2020    Priority: Medium  . 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 Prolapsed internal hemorrhoids, grade 3, s/p ligation/pexy 08/21/2020 08/21/2020  . Androgen deficiency 08/21/2020  . Chronic sinusitis 08/21/2020  . Gastroesophageal reflux disease with esophagitis 08/21/2020  . Insomnia 08/21/2020  . Obesity 08/21/2020  . Stress 08/21/2020  . Acute gout of left foot 08/21/2020  . Abdominal aortic atherosclerosis (HCC) 08/21/2020  . Acute respiratory disease due to COVID-19 virus 12/31/2019  . Hypertriglyceridemia 12/31/2019  . Gout 12/31/2019  . Sepsis due to COVID-19 (HCC) 12/31/2019  . Lactic acidosis 12/31/2019  . SIRS (systemic inflammatory response syndrome) (HCC) 12/31/2019    Past Medical History:  Diagnosis Date  . Anal fistula   . History of 2019 novel coronavirus disease (COVID-19) 12/31/2019   positive test in epic, pt hospital admission with acute respiratory failure, sirs, sepsis with viral pneumonia (08-15-2020  per pt went home on O2 for 2 months, per pt all symptoms resolved dec 2021)  . History of gout   . Hypogonadism in male   . Perirectal abscess 06/28/2019  . Plantar fasciitis, right   . RLS (restless legs syndrome)     Past Surgical History:  Procedure Laterality Date  . APPENDECTOMY  2001  . CHOLECYSTECTOMY  age 24s  . EVALUATION UNDER ANESTHESIA WITH HEMORRHOIDECTOMY N/A 08/21/2020   Procedure: ANORECTAL EXAM UNDER ANESTHESIA WITH , REPAIR OF PERIRECTAL FISTULA;  Surgeon:  Karie Soda, MD;  Location: [facility];  Service: General;  Laterality: N/A;  . INCISION AND DRAINAGE PERIRECTAL ABSCESS N/A 06/28/2019   Procedure: IRRIGATION AND DEBRIDEMENT PERIRECTAL ABSCESS;  Surgeon: Karie Soda, MD;  Location: [facility];  Service: General;  Laterality: N/A;    Social History   Socioeconomic History  . Marital  status: Married    Spouse name: Not on file  . Number of children: Not on file  . Years of education: Not on file  . Highest education level: Not on file  Occupational History  . Not on file  Tobacco Use  . Smoking status: Never Smoker  . Smokeless tobacco: Current User    Types: Snuff  Vaping Use  . Vaping Use: Never used  Substance and Sexual Activity  . Alcohol use: Yes    Comment: occasional  . Drug use: Never  . Sexual activity: Not on file  Other Topics Concern  . Not on file  Social History Narrative  . Not on file   Social Determinants of Health   Financial Resource Strain: Not on file  Food Insecurity: Not on file  Transportation Needs: Not on file  Physical Activity: Not on file  Stress: Not on file  Social Connections: Not on file  Intimate Partner Violence: Not on file    Family History  Problem Relation Age of Onset  . Heart failure Mother   . Diabetes Mother   . Cancer Father   . Diabetes Father     No medications prior to admission.    No current facility-administered medications for this encounter.   Current Outpatient Medications  Medication Sig Dispense Refill  . diazepam (VALIUM) 5 MG tablet Take 1 tablet (5 mg total) by mouth every 6 (six) hours as needed for muscle spasms (difficulty urinating). 8 tablet 2  . fenofibrate 54 MG tablet Take 54 mg by mouth at bedtime.    Marland Kitchen oxyCODONE (OXY IR/ROXICODONE) 5 MG immediate release tablet Take 1-2 tablets (5-10 mg total) by mouth every 6 (six) hours as needed for moderate pain, severe pain or breakthrough pain. 30 tablet 0  . testosterone cypionate  (DEPOTESTOSTERONE CYPIONATE) 200 MG/ML injection Inject 200 mg into the muscle every 14 (fourteen) days.    . traZODone (DESYREL) 150 MG tablet Take 150 mg by mouth at bedtime.       Allergies  Allergen Reactions  . Penicillins     Unknown childhood reaction bu per pt Has taken Penicillins as an adult with no reaction    BP (!) 161/94 (BP Location: Right Arm)   Pulse 79   Temp 98.5 F (36.9 C)   Resp 16   Ht 5\' 10"  (1.778 m)   Wt 116.4 kg   SpO2 98%   BMI 36.83 kg/m   Labs: No results found for this or any previous visit (from the past 48 hour(s)).  Imaging / Studies: No results found.   , M.D., F.A.C.S. Gastrointestinal and Minimally Invasive Surgery [facility] [address] [place], [place] [phone] Main / Paging  08/21/2020 10:56 AM

## 2020-08-21 NOTE — Progress Notes (Signed)
I discussed operative findings, updated the patient's status, discussed probable steps to recovery, and gave postoperative recommendations to the patient, spouse, and nurse.  Recommendations were made.  Questions were answered. Pt's spouse anxious but consolable.   They expressed understanding & appreciation.

## 2020-08-21 NOTE — Transfer of Care (Signed)
Immediate Anesthesia Transfer of Care Note  Patient: Joshua [name]  Procedure(s) Performed: Procedure(s) (LRB): ANORECTAL EXAM UNDER ANESTHESIA WITH , REPAIR OF PERIRECTAL FISTULA (N/A)  Patient Location: PACU  Anesthesia Type: General  Level of Consciousness: awake, sedated, patient cooperative and responds to stimulation  Airway & Oxygen Therapy: Patient Spontanous Breathing and Patient connected to [place] 02 and soft FM   Post-op Assessment: Report given to PACU RN, Post -op Vital signs reviewed and stable and Patient moving all extremities  Post vital signs: Reviewed and stable  Complications: No apparent anesthesia complications

## 2020-08-21 NOTE — Anesthesia Procedure Notes (Signed)
Procedure Name: Intubation Date/Time: 08/21/2020 11:58 AM Performed by: Justice Rocher, CRNA Pre-anesthesia Checklist: Patient identified, Emergency Drugs available, Suction available, Patient being monitored and Timeout performed Patient Re-evaluated:Patient Re-evaluated prior to induction Oxygen Delivery Method: Circle system utilized Preoxygenation: Pre-oxygenation with 100% oxygen Induction Type: IV induction Ventilation: Mask ventilation without difficulty Laryngoscope Size: Mac and 4 Grade View: Grade II Tube type: Oral Tube size: 7.5 mm Number of attempts: 1 Airway Equipment and Method: Stylet and Oral airway Placement Confirmation: ETT inserted through vocal cords under direct vision,  positive ETCO2,  breath sounds checked- equal and bilateral and CO2 detector Secured at: 23 cm Tube secured with: Tape Dental Injury: Teeth and Oropharynx as per pre-operative assessment

## 2020-08-21 NOTE — Discharge Instructions (Signed)
ANORECTAL SURGERY:  POST OPERATIVE INSTRUCTIONS  ######################################################################  EAT Start with a pureed / full liquid diet After 24 hours, gradually transition to a high fiber diet.    CONTROL PAIN Control pain so you can tolerate bowel movements,  walk, sleep, tolerate sneezing/coughing, and go up/down stairs.   HAVE A BOWEL MOVEMENT DAILY Keep your bowels regular to avoid problems.   Taking a fiber supplement every day to keep bowels soft.   Try a laxative to override constipation. Use an antidairrheal to slow down diarrhea.   Call if not better after 2 tries  WALK Walk an hour a day.  Control your pain to do that.   CALL IF YOU HAVE PROBLEMS/CONCERNS Call if you are still struggling despite following these instructions. Call if you have concerns not answered by these instructions  ######################################################################    1. Take your usually prescribed home medications unless otherwise directed.  2. DIET: Follow a light bland diet & liquids the first 24 hours after arrival home, such as soup, liquids, starches, etc.  Be sure to drink plenty of fluids.  Quickly advance to a usual solid diet within a few days.  Avoid fast food or heavy meals as your are more likely to get nauseated or have irregular bowels.  A low-fat, high-fiber diet for the rest of your life is ideal.  3. PAIN CONTROL: a. Pain is best controlled by a usual combination of three different methods TOGETHER: i. Ice/Heat ii. Over the counter pain medication iii. Prescription pain medication b. Expect swelling and discomfort in the anus/rectal area.  Warm water baths (30-60 minutes up to 6 times a day, especially after bowel meovements) will help. Use ice for the first few days to help decrease swelling and bruising, then switch to heat such as warm towels, sitz baths, warm baths, etc to help relax tight/sore spots and speed recovery.   Some people prefer to use ice alone, heat alone, alternating between ice & heat.  Experiment to what works for you.   c. It is helpful to take an over-the-counter pain medication continuously for the first few weeks.  Choose one of the following that works best for you: i. Naproxen (Aleve, etc)  Two 250m tabs twice a day ii. Ibuprofen (Advil, etc) Three 2072mtabs four times a day (every meal & bedtime) iii. Acetaminophen (Tylenol, etc) 500-65044mour times a day (every meal & bedtime) d. A  prescription for pain medication (such as oxycodone, hydrocodone, etc) should be given to you upon discharge.  Take your pain medication as prescribed.  i. If you are having problems/concerns with the prescription medicine (does not control pain, nausea, vomiting, rash, itching, etc), please call us [phone] see if we need to switch you to a different pain medicine that will work better for you and/or control your side effect better. ii. If you need a refill on your pain medication, please contact your pharmacy.  They will contact our office to request authorization. Prescriptions will not be filled after 5 pm or on week-ends.  If can take up to 48 hours for it to be filled & ready so avoid waiting until you are down to thel ast pill. e. A topical cream (Dibucaine) or a prescription for a cream (such as diltiazem 2% gel) may be given to you.  Many people find relief with topical creams.  Some people find it burns too much.  Experiment.  If it helps, use it.  If it burns, don't using  it.  Use a Sitz Bath 4-8 times a day for relief   [employer] A sitz bath is a warm water bath taken in the sitting position that covers only the hips and buttocks. It may be used for either healing or hygiene purposes. Sitz baths are also used to relieve pain, itching, or muscle spasms. The water may contain medicine. Moist heat will help you heal and relax.  HOME CARE INSTRUCTIONS  Take 3 to 4 sitz baths a day. 1. Fill the  bathtub half full with warm water. 2. Sit in the water and open the drain a little. 3. Turn on the warm water to keep the tub half full. Keep the water running constantly. 4. Soak in the water for 15 to 20 minutes. 5. After the sitz bath, pat the affected area dry first.   4. KEEP YOUR BOWELS REGULAR a. The goal is one soft bowel movement a day b. Avoid getting constipated.  Between the surgery and the pain medications, it is common to experience some constipation.  Increasing fluid intake and taking a fiber supplement (such as Metamucil, Citrucel, FiberCon, MiraLax, etc) 2-3 times a day regularly will usually help prevent this problem from occurring.  A mild laxative (prune juice, Milk of Magnesia, MiraLax, etc) should be taken according to package directions if there are no bowel movements after 48 hours. c. Watch out for diarrhea.  If you have many loose bowel movements, simplify your diet to bland foods & liquids for a few days.  Stop any stool softeners and decrease your fiber supplement.  Switching to mild anti-diarrheal medications (Kayopectate, Pepto Bismol) can help.  Can try an imodium/loperamide dose.  If this worsens or does not improve, please call us.  5. Wound Care  a. Remove your bandages with your first bowel movement, usually the day after surgery.  Let the gauze fall off with the first bowel movement or shower.   b. Wear an absorbent pad or soft cotton balls in your underwear as needed to catch any drainage and help keep the area  c. Keep the area clean and dry.  Bathe / shower every day.  Keep the area clean by showering / bathing over the incision / wound.   It is okay to soak an open wound to help wash it.  Consider using a squeeze bottle filled with warm water to gently wash the anal area.  Wet wipes or showers / gentle washing after bowel movements is often less traumatic than regular toilet paper. d. Dennis Bast will often notice bleeding with bowel movements.  This should slow down  by the end of the first week of surgery.  Sitting on an ice pack can help. e. Expect some drainage.  This should slow down by the end of the first week of surgery, but you will have occasional bleeding or drainage up to a few months after surgery.  Wear an absorbent pad or soft cotton gauze in your underwear until the drainage stops.  6. ACTIVITIES as tolerated:   a. You may resume regular (light) daily activities beginning the next day--such as daily self-care, walking, climbing stairs--gradually increasing activities as tolerated.  If you can walk 30 minutes without difficulty, it is safe to try more intense activity such as jogging, treadmill, bicycling, low-impact aerobics, swimming, etc. b. Save the most intensive and strenuous activity for last such as sit-ups, heavy lifting, contact sports, etc  Refrain from any heavy lifting or straining until you are off narcotics for  pain control.   c. DO NOT PUSH THROUGH PAIN.  Let pain be your guide: If it hurts to do something, don't do it.  Pain is your body warning you to avoid that activity for another week until the pain goes down. d. You may drive when you are no longer taking prescription pain medication, you can comfortably sit for long periods of time, and you can safely maneuver your car and apply brakes. e. Bonita Quin may have sexual intercourse when it is comfortable.  7. FOLLOW UP in our office a. Please call [facility] at [phone] to set up an appointment to see your surgeon in the office for a follow-up appointment approximately 2-3 weeks after your surgery. b. Make sure that you call for this appointment the day you arrive home to ensure a convenient appointment time.  8. IF YOU HAVE DISABILITY OR FAMILY LEAVE FORMS, BRING THEM TO THE OFFICE FOR PROCESSING.  DO NOT GIVE THEM TO YOUR DOCTOR.        WHEN TO CALL us [phone]: 1. Poor pain control 2. Reactions / problems with new medications (rash/itching, nausea, etc)  3. Fever over  101.5 F (38.5 C) 4. Inability to urinate 5. Nausea and/or vomiting 6. Worsening swelling or bruising 7. Continued bleeding from incision. 8. Increased pain, redness, or drainage from the incision  The clinic staff is available to answer your questions during regular business hours (8:30am-5pm).  Please don't hesitate to call and ask to speak to one of our nurses for clinical concerns.   A surgeon from [facility] is always on call at the hospitals   If you have a medical emergency, go to the nearest emergency room or call 911.    [facility] [address], [place], [place]  [address] ? MAIN: [phone] ? TOLL FREE: [phone] ? FAX [phone] [email or web address]     Anal Fistula  An anal fistula is a hole that develops between the bowel and the skin near the anus. The anus allows stool (feces) to leave the body. The anus has many tiny glands that make lubricating fluid. Sometimes, these glands become plugged and infected. This can cause a fluid-filled pocket (abscess) to form. An anal fistula often occurs when an abscess becomes infected and then develops into a hole between the bowel and the skin. What are the causes? In most cases, an anal fistula is caused by a past or current buildup of pus around the anus (anal abscess). Other causes include:  A complication of surgery.  Injury to the rectum or the area around it.  Using high-energy beams (radiation) to treat the area around the rectum. What increases the risk? You are more likely to develop this condition if you have certain medical conditions or diseases, including:  Chronic inflammatory bowel disease, such as Crohn's disease or ulcerative colitis.  Colon cancer or rectal cancer.  Diverticular disease, such as diverticulitis.  A sexually transmitted infection, or STI, such as gonorrhea, chlamydia, or syphilis.  An infection that is caused by  HIV. What are the signs or symptoms? Symptoms of this condition include:  Throbbing or constant pain that may be worse while you are sitting.  Swelling or irritation around the anus.  Pus or blood from an opening near the anus.  Pain when passing stool.  Fever or chills. How is this diagnosed? This condition is diagnosed based on:  A physical exam. This may include: ? An exam to find  the external opening of the fistula. ? An exam with a probe or scope to help locate the internal opening of the fistula. ? An exam of the rectum with a gloved hand (digital rectal exam).  Imaging tests that use dye to find the exact location and path of the fistula. Tests may include: ? X-rays. ? Ultrasound. ? CT scan. ? MRI.  Other tests to find the cause of the anal fistula. How is this treated? This condition is most commonly treated with surgery. The type of surgery that is used will depend on where the fistula is located and how complex the fistula is. Surgery may include:  A fistulotomy. The whole fistula is opened up, and the contents are drained to promote healing.  Seton placement. A silk string (seton) is placed into the fistula during a fistulotomy. This helps to drain any infection and promote healing.  Advancement flap procedure. Tissue is removed from your rectum or the skin around the anus and attached to the opening of the fistula.  Bioprosthetic plug. A cone-shaped plug is made from your tissue and is used to block the opening of the fistula. Some anal fistulas do not require surgery. A nonsurgical treatment option involves injecting a fibrin glue to seal the fistula. You also may be prescribed an antibiotic medicine to treat any infection. Follow these instructions at home: Medicines  Take over-the-counter and prescription medicines only as told by your health care provider.  If you were prescribed an antibiotic medicine, take it as told by your health care provider. Do not  stop taking the antibiotic even if you start to feel better.  Use a stool softener or a laxative if told to do so by your health care provider. General instructions  Eat a high-fiber diet as told by your health care provider. This can help to prevent constipation.  Drink enough fluid to keep your urine pale yellow.  Take a warm sitz bath for 15-20 minutes, 3-4 times per day, or as told by your health care provider. Sitz baths can ease your pain and discomfort and help with healing.  Follow good hygiene to keep the anal area as clean and dry as possible. Use wet toilet paper or a moist towelette after each bowel movement.  Keep all follow-up visits as told by your health care provider. This is important.   Contact a health care provider if you have:  Increased pain that is not controlled with medicines.  New redness or swelling around the anal area.  New fluid, blood, or pus coming from the anal area.  Tenderness or warmth around the anal area. Get help right away if you have:  A fever.  Severe pain.  Chills or diarrhea.  Severe problems urinating or having a bowel movement. Summary  An anal fistula is a hole that develops between the bowel and the skin near the anus.  This condition is most often caused by a buildup of pus around the anus (anal abscess). Other causes include a complication of surgery, an injury to the rectum, or the use of radiation to treat the rectal area.  This condition is most commonly treated with surgery.  Follow your health care provider's instructions about taking medicines, eating and drinking, or taking sitz baths.  Call your health care provider if you have more pain, swelling, or blood. Get help right away if you have fever, severe pain, or problems passing urine or stool. This information is not intended to replace advice given to  you by your health care provider. Make sure you discuss any questions you have with your health care  provider. Document Revised: 08/19/2017 Document Reviewed: 08/19/2017 Elsevier Patient Education  2021 [employer].   Post Anesthesia Home Care Instructions  Activity: Get plenty of rest for the remainder of the day. A responsible individual must stay with you for 24 hours following the procedure.  For the next 24 hours, DO NOT: -Drive a car -Advertising copywriter -Drink alcoholic beverages -Take any medication unless instructed by your physician -Make any legal decisions or sign important papers.  Meals: Start with liquid foods such as gelatin or soup. Progress to regular foods as tolerated. Avoid greasy, spicy, heavy foods. If nausea and/or vomiting occur, drink only clear liquids until the nausea and/or vomiting subsides. Call your physician if vomiting continues.  Special Instructions/Symptoms: Your throat may feel dry or sore from the anesthesia or the breathing tube placed in your throat during surgery. If this causes discomfort, gargle with warm salt water. The discomfort should disappear within 24 hours.

## 2020-08-21 NOTE — Progress Notes (Signed)
Patient resting in recovery comfortably.  Apparently wife wish to talk to me but I cannot find her.  Updated recovery room nurse.  We will see if I can find her in a few more minutes

## 2020-08-21 NOTE — Op Note (Addendum)
08/21/2020  1:21 PM  PATIENT:  Joshua [name]  55 y.o. 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  See anesthesia record  Delay start of Pharmacological VTE agent (>24hrs) due to surgical blood loss or risk of bleeding:  no  DRAINS: none   SPECIMEN:  FISTULA TRACT (internal & external components)  DISPOSITION OF SPECIMEN:  PATHOLOGY  COUNTS:  YES  PLAN OF CARE: Discharge to home after PACU  PATIENT DISPOSITION:  PACU - hemodynamically stable.  INDICATION: Patient with probable perirectal fistula.  I recommended examination and surgical treatment:  The anatomy & physiology of the anorectal region was discussed.  We discussed the pathophysiology of anorectal abscess and fistula.  Differential diagnosis was discussed.  Natural history progression was discussed.   I stressed the importance of a bowel regimen to have daily soft bowel movements to minimize progression of disease.     The patient's condition is not adequately controlled.  Non-operative treatment has not healed the fistula.  Therefore, I recommended examination under anaesthesia to confirm the diagnosis and treat the fistula.  I discussed techniques that may be required such as fistulotomy, ligation by LIFT technique, and/or seton placement.   Benefits & alternatives discussed.  I noted a good likelihood this will help address the problem, but sometimes repeat operations and prolonged healing times may occur.  Risks such as bleeding, pain, recurrence, reoperation, incontinence, heart attack, death, and other risks were discussed.      Educational handouts further explaining the pathology, treatment options, and bowel regimen were given.  The patient expressed understanding & wishes to proceed.  We will work to coordinate surgery for a mutually convenient time.   OR FINDINGS: Patient had an intersphincteric fistula.    External location LEFT ANTERIOR   about 2 cm from anal verge.  Internal location : Anterior midline at anal crypt about 1.5 cm from anal verge.  DESCRIPTION:   Informed consent was confirmed. Patient underwent general anesthesia without difficulty. Patient was placed into prone positioning.  The perianal region was prepped and draped in sterile fashion. Surgical timeout confirmed or plan.  I did digital rectal examination and then transitioned over to anoscopy to get a sense of the anatomy.  I did place a probe through the external opening.  I also injected the track with methylene blue.  With this I was able to locate an internal opening.  The tract did not feel superficial, concerning for a probable intersphincteric fistula.  No abscess located.  I went ahead and proceeded with the LIFT technique.  I made a transverse incision at the anal squamocolumnar junction .  Did careful dissection to get down to the sphincter complex.  I carefully went between the internal and external sphincter using careful blunt dissection parallel to the fibers.  I was able to locate the intersphincteric component of the fistulous tract.  I was able  to get around it gently with a right angle clamp.  I carefully skeletonized the intersphincteric component.  I placed 2-0 Vicryl stitches through the intersphincteric tract on the proximal side and  on the distal side in between the external & internal sphincters.  I transected the intersphincteric segment of the fistulous tract.   I ligated the stumps of the transected segments with 2-0 Vicryl again with a figure-of-eight stitch in a 90 degree fashion to doubly ligate and prove that the tract had been closed.   I removed the superficial external end of the fistulous tract & ligated the base of the external wound just outside the sphincter component with 2-0 vicryl.  I then transitioned to the rectal component.  Did a figure-of-eight stitch of 2-0 Vicryl suture 6 centimeters proximal to the internal opening in the rectum along a hemorrhoidal column left anterior.  I excised the internal opening at the anal crypt along with a  hemorrhoidectomy using a vertical fusiform ellipsoid excision.  Make care to spare anoderm.  I then ran the 2-0 Vicryl stitch down to the anal verge to also help cover up the intersphincteric ligation wound as well.  I tied that running suture down, thus closing the internal opening and protecting the LIFT repair.  Hemostasis was excellent.  I then excised the external opening with a radial biconcave incision around it.  I transitioned to cautery and help free the fistulous tract circumferentially all way down towards the sphincter component.  Is a 2-0 Vicryl suture transversely and sagittally to close the fistulous tract external component at the level of the sphincters to good results.  Resulting wound was 2 x 2 cm near the anterior midline involving the raphae  I reexamined the anal canal.   Patient still has significant enlarged hemorrhoids in the left lateral and right posterior columns.  I used 2-0 Vicryl to do a running locking suture for hemorrhoid ligation as well as pexy over the [facility].  There was no narrowing.  Hemostasis was excellent.  I repeated anoscopy and examination.  Hemostasis was good.  We placed fluff gauze to onlay over the wounds.  No  packing done.  Patient is being extubated go to recovery room.  I am about to discuss the patient's status to the family.  Instructions are written as well.  I made an attempt to locate family to discuss patient's status and recommendations.  No one is available at this time.     Ardeth Sportsman, M.D., F.A.C.S. Gastrointestinal and Minimally Invasive Surgery [facility] [address], [address] [place], [place] [address] [phone] Main / Paging

## 2020-08-22 ENCOUNTER — Encounter (HOSPITAL_BASED_OUTPATIENT_CLINIC_OR_DEPARTMENT_OTHER): Payer: Self-pay | Admitting: Surgery

## 2020-08-22 NOTE — Anesthesia Postprocedure Evaluation (Signed)
Anesthesia Post Note  Patient: Joshua [name]  Procedure(s) Performed: ANORECTAL EXAM UNDER ANESTHESIA WITH , REPAIR OF PERIRECTAL FISTULA (N/A Rectum)     Patient location during evaluation: PACU Anesthesia Type: General Level of consciousness: awake and alert and oriented Pain management: pain level controlled Vital Signs Assessment: post-procedure vital signs reviewed and stable Respiratory status: spontaneous breathing, nonlabored ventilation and respiratory function stable Cardiovascular status: blood pressure returned to baseline Postop Assessment: no apparent nausea or vomiting Anesthetic complications: no   No complications documented.  Last Vitals:  Vitals:   08/21/20 1430 08/21/20 1550  BP: (!) 151/92 (!) 161/94  Pulse: 81 79  Resp: 18 16  Temp: 36.4 C 36.9 C  SpO2: 96% 98%    Last Pain:  Vitals:   08/21/20 1545  TempSrc:   PainSc: 4                  Kaylyn Layer

## 2020-08-25 LAB — SURGICAL PATHOLOGY

## 2020-09-24 ENCOUNTER — Other Ambulatory Visit: Payer: Self-pay

## 2020-09-24 ENCOUNTER — Ambulatory Visit (INDEPENDENT_AMBULATORY_CARE_PROVIDER_SITE_OTHER): Payer: 59 | Admitting: Podiatry

## 2020-09-24 ENCOUNTER — Ambulatory Visit (INDEPENDENT_AMBULATORY_CARE_PROVIDER_SITE_OTHER): Payer: 59

## 2020-09-24 DIAGNOSIS — M722 Plantar fascial fibromatosis: Secondary | ICD-10-CM

## 2020-09-24 MED ORDER — METHYLPREDNISOLONE 4 MG PO TBPK: ORAL_TABLET | ORAL | 0 refills | Status: DC

## 2020-09-24 MED ORDER — BETAMETHASONE SOD PHOS & ACET 6 (3-3) MG/ML IJ SUSP
3.0000 mg | Freq: Once | INTRAMUSCULAR | Status: AC
  Administered 2020-09-24: 3 mg via INTRA_ARTICULAR

## 2020-09-24 MED ORDER — MELOXICAM 15 MG PO TABS: 15.0000 mg | ORAL_TABLET | Freq: Every day | ORAL | 1 refills | Status: DC

## 2020-09-24 NOTE — Progress Notes (Signed)
   Subjective: 55 y.o. male presenting to the office today for evaluation of right heel pain is been going on for approximately 1 year now.  He denies a history of injury.  Pain has slowly increased.  He presents for further treatment and evaluation   Past Medical History:  Diagnosis Date  . Anal fistula   . History of 2019 novel coronavirus disease (COVID-19) 12/31/2019   positive test in epic, pt hospital admission with acute respiratory failure, sirs, sepsis with viral pneumonia (08-15-2020  per pt went home on O2 for 2 months, per pt all symptoms resolved dec 2021)  . History of gout   . Hypogonadism in male   . Perirectal abscess 06/28/2019  . Plantar fasciitis, right   . RLS (restless legs syndrome)      Objective: Physical Exam General: The patient is alert and oriented x3 in no acute distress.  Dermatology: Skin is warm, dry and supple bilateral lower extremities. Negative for open lesions or macerations bilateral.   Vascular: Dorsalis Pedis and Posterior Tibial pulses palpable bilateral.  Capillary fill time is immediate to all digits.  Neurological: Epicritic and protective threshold intact bilateral.   Musculoskeletal: Tenderness to palpation to the plantar aspect of the right heel along the plantar fascia. All other joints range of motion within normal limits bilateral. Strength 5/5 in all groups bilateral.   Radiographic exam: Normal osseous mineralization. Joint spaces preserved. No fracture/dislocation/boney destruction. No other soft tissue abnormalities or radiopaque foreign bodies.   Assessment: 1. Plantar fasciitis right  Plan of Care:  1. Patient evaluated. Xrays reviewed.   2. Injection of 0.5cc Celestone soluspan injected into the right plantar fascia  3. Rx for Medrol Dose Pack placed 4. Rx for Meloxicam ordered for patient. 5. Plantar fascial band(s) dispensed 6. Instructed patient regarding therapies and modalities at home to alleviate symptoms.  7.  Return to clinic in 4 weeks.    *Truck driver   Felecia Shelling, DPM [facility]  Dr. Felecia Shelling, DPM    [address] [place], [place] [address]                Office [phone]  Fax [phone]

## 2020-10-27 ENCOUNTER — Ambulatory Visit: Payer: 59 | Admitting: Podiatry

## 2021-02-23 ENCOUNTER — Ambulatory Visit (INDEPENDENT_AMBULATORY_CARE_PROVIDER_SITE_OTHER): Payer: 59 | Admitting: Podiatry

## 2021-02-23 ENCOUNTER — Other Ambulatory Visit: Payer: Self-pay

## 2021-02-23 DIAGNOSIS — M722 Plantar fascial fibromatosis: Secondary | ICD-10-CM | POA: Diagnosis not present

## 2021-02-23 MED ORDER — BETAMETHASONE SOD PHOS & ACET 6 (3-3) MG/ML IJ SUSP: 3.0000 mg | Freq: Once | INTRAMUSCULAR | Status: DC

## 2021-02-23 MED ORDER — MELOXICAM 15 MG PO TABS: 15.0000 mg | ORAL_TABLET | Freq: Every day | ORAL | 1 refills | Status: DC

## 2021-02-23 MED ORDER — METHYLPREDNISOLONE 4 MG PO TBPK: ORAL_TABLET | ORAL | 0 refills | Status: DC

## 2021-02-23 NOTE — Progress Notes (Signed)
   Subjective: 55 y.o. male presenting to the office today for recurrence of right heel pain this been going on for few months now.  Patient states that last visit and 09/24/2020 he got significantly better with the steroid injection and the plantar fascial brace with the oral anti-inflammatories.  He states that over the past few months he is had to do an increased amount of work outside and has been experiencing right heel pain.  He presents for further treatment evaluation   Past Medical History:  Diagnosis Date   Anal fistula    History of 2019 novel coronavirus disease (COVID-19) 12/31/2019   positive test in epic, pt hospital admission with acute respiratory failure, sirs, sepsis with viral pneumonia (08-15-2020  per pt went home on O2 for 2 months, per pt all symptoms resolved dec 2021)   History of gout    Hypogonadism in male    Perirectal abscess 06/28/2019   Plantar fasciitis, right    RLS (restless legs syndrome)      Objective: Physical Exam General: The patient is alert and oriented x3 in no acute distress.  Dermatology: Skin is warm, dry and supple bilateral lower extremities. Negative for open lesions or macerations bilateral.   Vascular: Dorsalis Pedis and Posterior Tibial pulses palpable bilateral.  Capillary fill time is immediate to all digits.  Neurological: Epicritic and protective threshold intact bilateral.   Musculoskeletal: Tenderness to palpation to the plantar aspect of the right heel along the plantar fascia. All other joints range of motion within normal limits bilateral. Strength 5/5 in all groups bilateral.   Radiographic exam: Normal osseous mineralization. Joint spaces preserved. No fracture/dislocation/boney destruction. No other soft tissue abnormalities or radiopaque foreign bodies.   Assessment: 1. Plantar fasciitis right  Plan of Care:  1. Patient evaluated. Xrays reviewed.   2. Injection of 0.5cc Celestone soluspan injected into the right  plantar fascia  3. Rx for Medrol Dose Pack placed 4. Rx for Meloxicam ordered for patient. 5. Plantar fascial band(s) dispensed 6. Instructed patient regarding therapies and modalities at home to alleviate symptoms.  7. Return to clinic in 4 weeks.    *Truck driver   Felecia Shelling, DPM [facility]  Dr. Felecia Shelling, DPM    [address] [place], [place] [address]                Office [phone]  Fax [phone]

## 2021-03-30 ENCOUNTER — Ambulatory Visit: Payer: 59 | Admitting: Podiatry

## 2021-04-06 ENCOUNTER — Ambulatory Visit: Payer: 59 | Admitting: Podiatry

## 2021-04-06 ENCOUNTER — Other Ambulatory Visit: Payer: Self-pay

## 2021-04-06 DIAGNOSIS — M722 Plantar fascial fibromatosis: Secondary | ICD-10-CM | POA: Diagnosis not present

## 2021-04-06 MED ORDER — MELOXICAM 15 MG PO TABS: 15.0000 mg | ORAL_TABLET | Freq: Every day | ORAL | 1 refills | Status: DC

## 2021-04-06 NOTE — Progress Notes (Signed)
° °  HPI: 55 y.o. male presenting today for follow-up evaluation of plantar fasciitis to the right foot.  Patient states that he is significantly better.  He is not 100% but doing well.  He did not take the meloxicam daily however he did take the Medrol Dosepak.  He presents for follow-up treatment and evaluation.  Past Medical History:  Diagnosis Date   Anal fistula    History of 2019 novel coronavirus disease (COVID-19) 12/31/2019   positive test in epic, pt hospital admission with acute respiratory failure, sirs, sepsis with viral pneumonia (08-15-2020  per pt went home on O2 for 2 months, per pt all symptoms resolved dec 2021)   History of gout    Hypogonadism in male    Perirectal abscess 06/28/2019   Plantar fasciitis, right    RLS (restless legs syndrome)     Past Surgical History:  Procedure Laterality Date   APPENDECTOMY  2001   CHOLECYSTECTOMY  age 51s   EVALUATION UNDER ANESTHESIA WITH HEMORRHOIDECTOMY N/A 08/21/2020   Procedure: ANORECTAL EXAM UNDER ANESTHESIA WITH , REPAIR OF PERIRECTAL FISTULA;  Surgeon: Karie Soda, MD;  Location: [facility];  Service: General;  Laterality: N/A;   INCISION AND DRAINAGE PERIRECTAL ABSCESS N/A 06/28/2019   Procedure: IRRIGATION AND DEBRIDEMENT PERIRECTAL ABSCESS;  Surgeon: Karie Soda, MD;  Location: [facility];  Service: General;  Laterality: N/A;     Physical Exam: General: The patient is alert and oriented x3 in no acute distress.  Dermatology: Skin is warm, dry and supple bilateral lower extremities. Negative for open lesions or macerations.  Vascular: Palpable pedal pulses bilaterally. Capillary refill within normal limits.  Negative for any significant edema or erythema  Neurological: Light touch and protective threshold grossly intact  Musculoskeletal Exam: There continues to be some slight tenderness to the plantar medial aspect of the heel.  Significantly improved since last visit  Assessment: 1.  Plantar fasciitis  right   Plan of Care:  1. Patient evaluated.  2.  Patient declined an additional injection today 3.  Refill sent for meloxicam 15 mg daily 4.  Continue plantar fascial brace as needed with [place] shoes 5.  Return to clinic as needed  *Truck driver    Felecia Shelling, DPM [facility]  Dr. Felecia Shelling, DPM    [address] [place], [place] [address]                Office [phone]  Fax [phone]

## 2021-05-16 IMAGING — CT CT PELVIS W/ CM
2 of 4 series · 16 of 46 positions shown, 18 images · IV contrast (omnipaque)
Comparison: 07/28/2010 unenhanced CT abdomen/pelvis.

CLINICAL DATA: History of surgical treatment of rectal abscess
06/28/2019. Pain and swelling in the perirectal region.

EXAM:
CT PELVIS WITH CONTRAST
TECHNIQUE: Multidetector CT imaging of the pelvis was performed using the
standard protocol following the bolus administration of intravenous
contrast.
CONTRAST:  100mL OMNIPAQUE IOHEXOL 300 MG/ML  SOLN

[Series 9: coronal st · coronal · 0.74mm/px · 3 of 170 slices shown]
[im 57/170  soft-tissue]
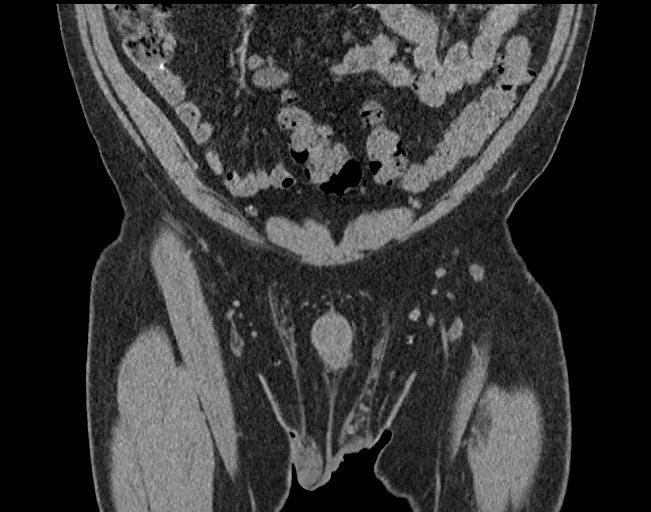
[im 76/170  soft-tissue]
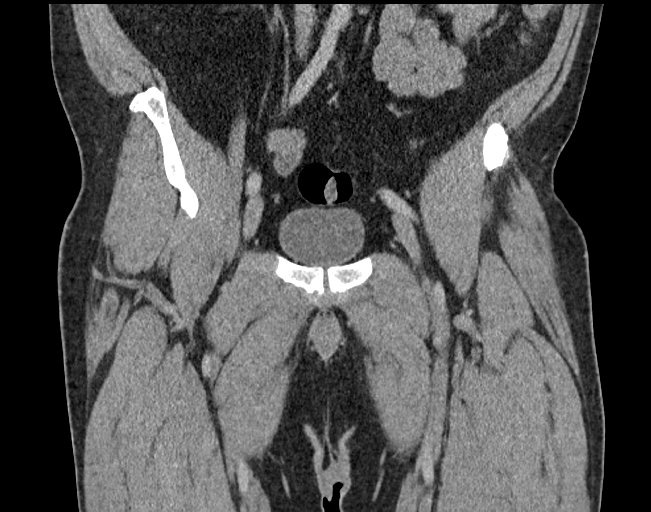
[im 94/170  soft-tissue]
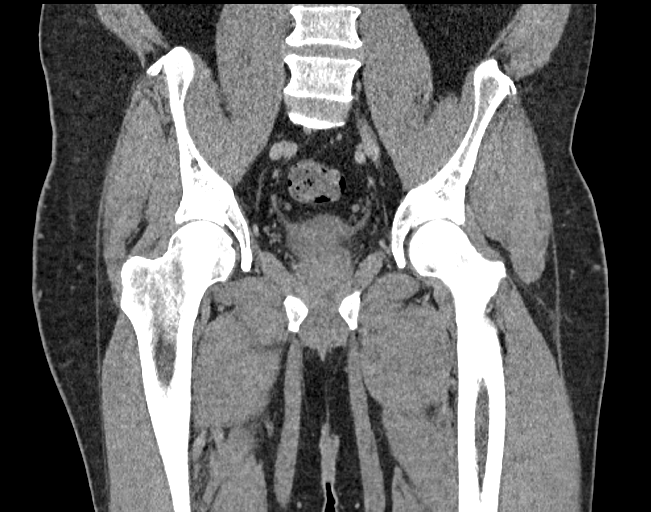

[Series 11: pelvis thin · axial · 0.98mm/px · z∈[+501,+849]mm · 13 of 635 slices shown, 15 images]
[im 27/635  soft-tissue]
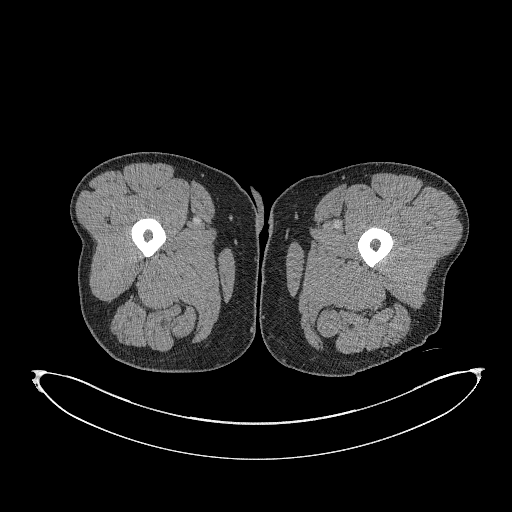
[im 27/635  bone]
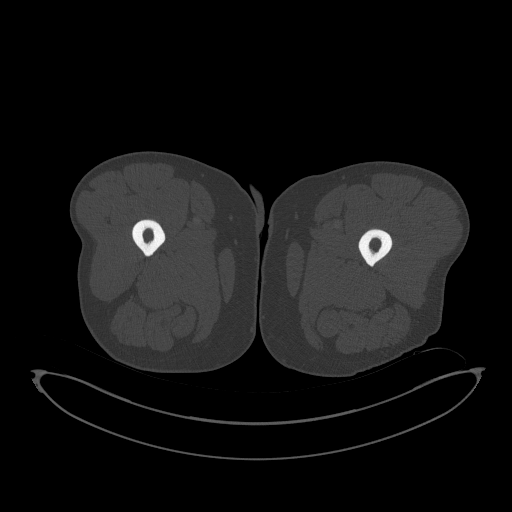
[im 80/635  soft-tissue]
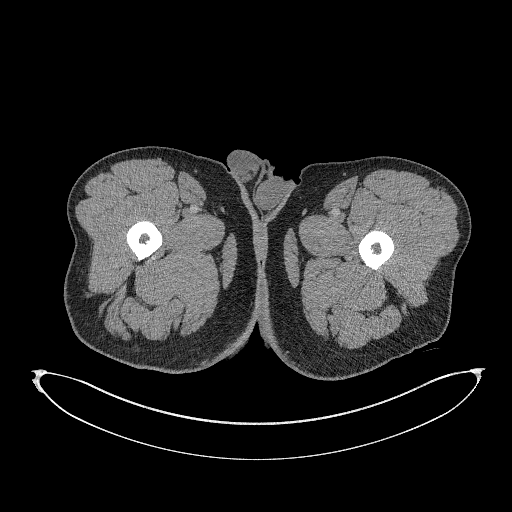
[im 133/635  soft-tissue]
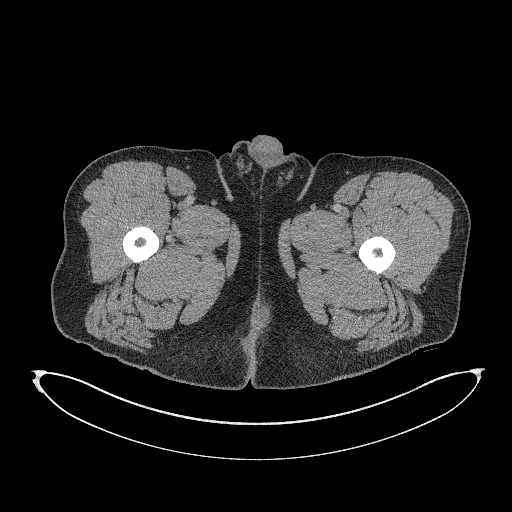
[im 185/635  soft-tissue]
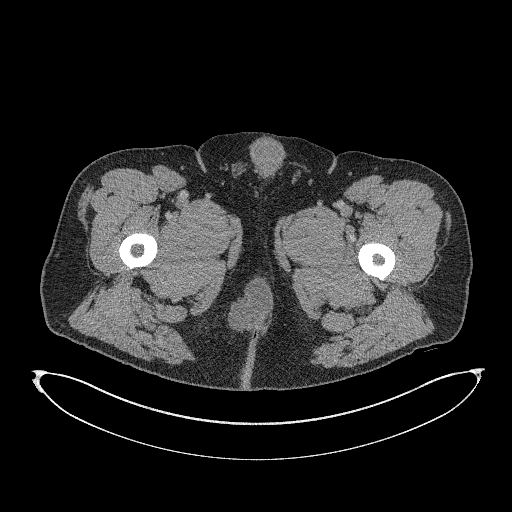
[im 212/635  soft-tissue]
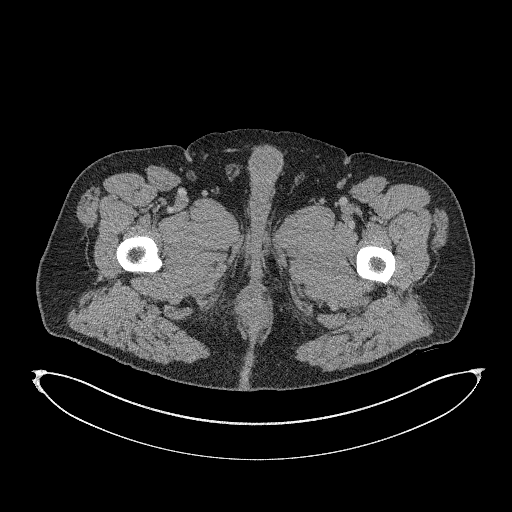
[im 265/635  soft-tissue]
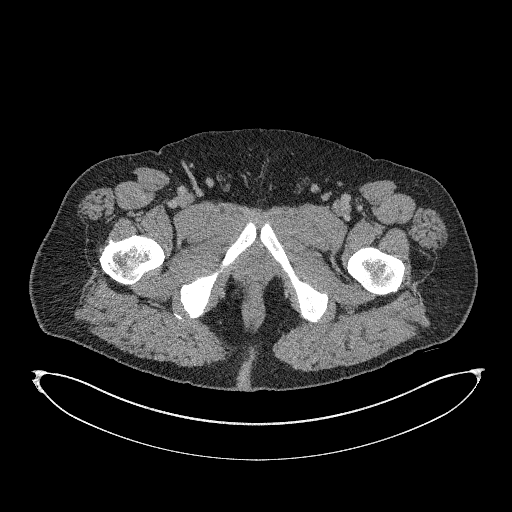
[im 318/635  soft-tissue]
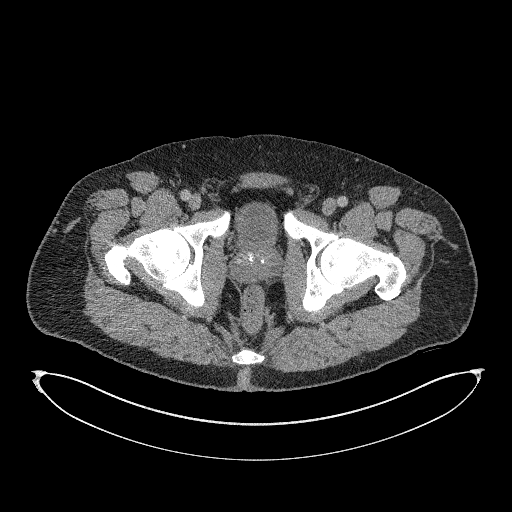
[im 370/635  soft-tissue]
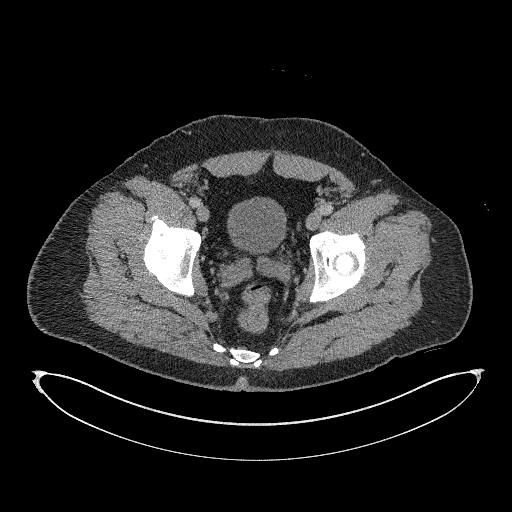
[im 423/635  soft-tissue]
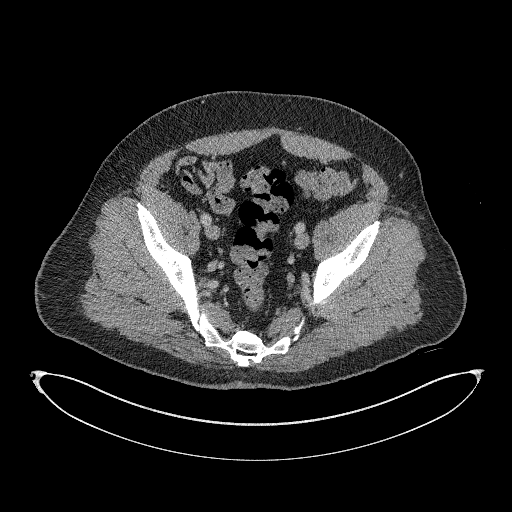
[im 423/635  bone]
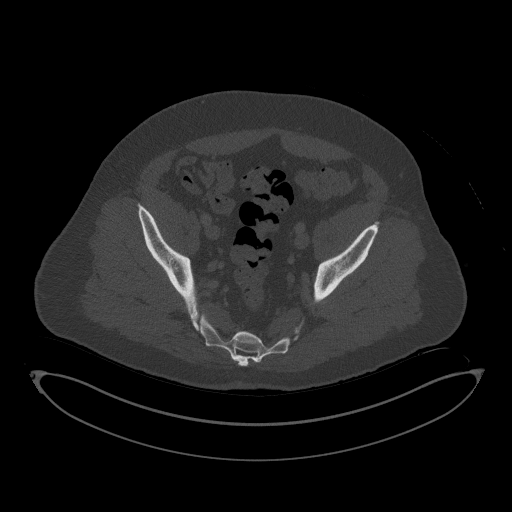
[im 450/635  soft-tissue]
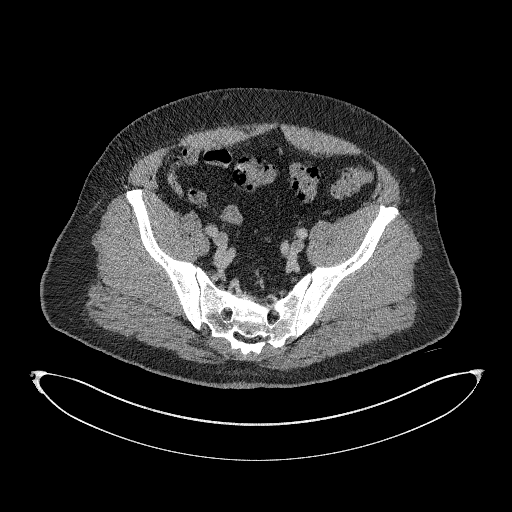
[im 502/635  soft-tissue]
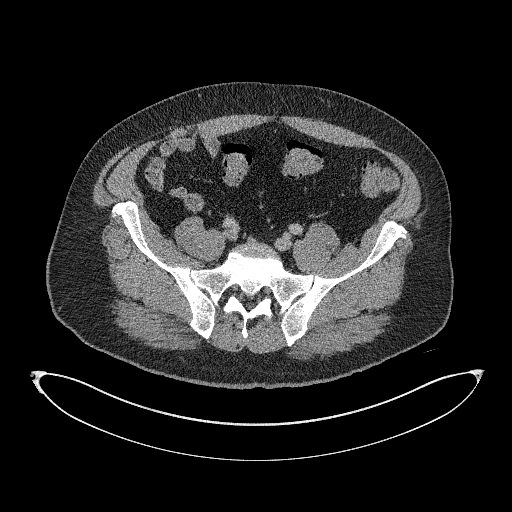
[im 555/635  soft-tissue]
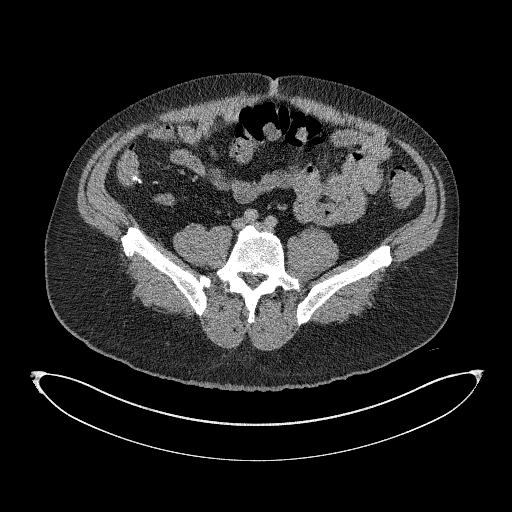
[im 608/635  soft-tissue]
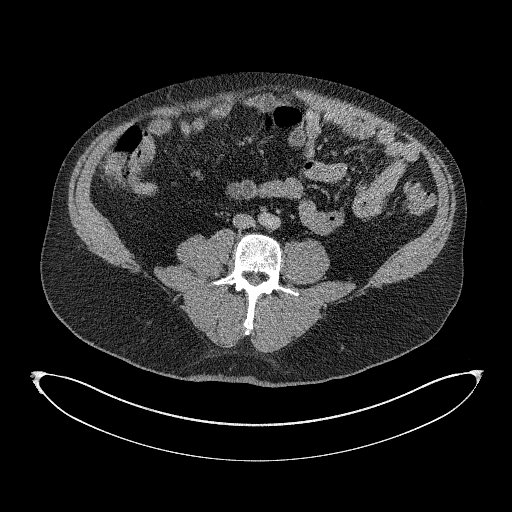

[16 of 46 positions shown; findings below may reference images not displayed]

FINDINGS: Urinary Tract: Normal urinary bladder. Pelvic ureters are normal
caliber.

Bowel: Somewhat horseshoe-shaped perianal abscess measures 5.3 x
x 4.6 cm (series 4/image 137) with associated wall thickening and
enhancement, centered in the perineum just to the right of midline,
without internal gas. Otherwise unremarkable pelvic bowel loops.

Vascular/Lymphatic: No acute vascular abnormality. No pathologically
enlarged pelvic lymph nodes.

Reproductive: Normal size prostate with nonspecific coarse internal
prostatic calcifications. Symmetric normal seminal vesicles.

Other:  No pneumoperitoneum or ascites in the pelvis.

Musculoskeletal: No aggressive appearing focal osseous lesions. Mild
lower lumbar spondylosis.
IMPRESSION: Somewhat horseshoe-shaped perianal abscess measures 5.3 x 3.2 x
cm, centered in the perineum just to the right of midline.

## 2021-08-04 ENCOUNTER — Telehealth: Payer: Self-pay | Admitting: Podiatry

## 2021-08-04 NOTE — Telephone Encounter (Signed)
Patient called he is having pain in the Right side of his foot,  its a sharp burning sensation, and it has been swollen.  No known injury to foot.  Is this part of PF ?  Patient wants to know what he can do to alleviate some of the pain until he can get an appointment, he is an OTR truck driver? Please advise

## 2021-08-05 ENCOUNTER — Other Ambulatory Visit: Payer: Self-pay | Admitting: Podiatry

## 2021-08-05 MED ORDER — METHYLPREDNISOLONE 4 MG PO TBPK: ORAL_TABLET | ORAL | 0 refills | Status: DC

## 2021-08-05 MED ORDER — MELOXICAM 15 MG PO TABS: 15.0000 mg | ORAL_TABLET | Freq: Every day | ORAL | 1 refills | Status: DC

## 2021-08-05 NOTE — Telephone Encounter (Signed)
This sounds like it is not typical plantar fasciitis.  I will not really know that until I can evaluate him.  In the meantime I will send in a prescription for a Medrol Dosepak and meloxicam 15 mg daily to begin taking after completion of the Dosepak.-Thanks, Dr. Logan Bores

## 2021-08-06 NOTE — Telephone Encounter (Signed)
Called patient and went over instructions given, patient very appreciative of all the assistance. He will start the medication (Medrol Dosepak) today and will see you on May 8th for follow up

## 2021-08-24 ENCOUNTER — Ambulatory Visit (INDEPENDENT_AMBULATORY_CARE_PROVIDER_SITE_OTHER): Payer: 59 | Admitting: Podiatry

## 2021-08-24 ENCOUNTER — Encounter: Payer: Self-pay | Admitting: Podiatry

## 2021-08-24 ENCOUNTER — Ambulatory Visit (INDEPENDENT_AMBULATORY_CARE_PROVIDER_SITE_OTHER): Payer: 59

## 2021-08-24 DIAGNOSIS — M7671 Peroneal tendinitis, right leg: Secondary | ICD-10-CM | POA: Diagnosis not present

## 2021-08-24 DIAGNOSIS — R52 Pain, unspecified: Secondary | ICD-10-CM

## 2021-08-24 DIAGNOSIS — S9031XA Contusion of right foot, initial encounter: Secondary | ICD-10-CM | POA: Diagnosis not present

## 2021-08-24 MED ORDER — BETAMETHASONE SOD PHOS & ACET 6 (3-3) MG/ML IJ SUSP: 3.0000 mg | Freq: Once | INTRAMUSCULAR | Status: DC

## 2021-08-24 NOTE — Progress Notes (Signed)
? ?  HPI: 56 y.o. male presenting today for new complaint of pain regarding an injury that the patient sustained to the right foot 3 days ago.  He missed a step carrying boxes.  He has been having some pain and tenderness ever since.  He presents for further treatment evaluation ? ?Past Medical History:  ?Diagnosis Date  ? Anal fistula   ? History of 2019 novel coronavirus disease (COVID-19) 12/31/2019  ? positive test in epic, pt hospital admission with acute respiratory failure, sirs, sepsis with viral pneumonia (08-15-2020  per pt went home on O2 for 2 months, per pt all symptoms resolved dec 2021)  ? History of gout   ? Hypogonadism in male   ? Perirectal abscess 06/28/2019  ? Plantar fasciitis, right   ? RLS (restless legs syndrome)   ? ? ?Past Surgical History:  ?Procedure Laterality Date  ? APPENDECTOMY  2001  ? CHOLECYSTECTOMY  age 13s  ? EVALUATION UNDER ANESTHESIA WITH HEMORRHOIDECTOMY N/A 08/21/2020  ? Procedure: ANORECTAL EXAM UNDER ANESTHESIA WITH , REPAIR OF PERIRECTAL FISTULA;  Surgeon: Michael Boston, MD;  Location: [facility];  Service: General;  Laterality: N/A;  ? [place] N/A 06/28/2019  ? Procedure: IRRIGATION AND DEBRIDEMENT PERIRECTAL ABSCESS;  Surgeon: Michael Boston, MD;  Location: [facility];  Service: General;  Laterality: N/A;  ? ? ?Allergies  ?Allergen Reactions  ? Oxycodone Hcl   ?  Other reaction(s): nausea  ? Penicillins   ?  Unknown childhood reaction bu per pt Has taken Penicillins as an adult with no reaction  ? ?  ?Physical Exam: ?General: The patient is alert and oriented x3 in no acute distress. ? ?Dermatology: Skin is warm, dry and supple bilateral lower extremities. Negative for open lesions or macerations. ? ?Vascular: Palpable pedal pulses bilaterally. Capillary refill within normal limits.  There is some minimal edema noted specifically to the lateral aspect of the foot ? ?Neurological: Light touch and protective threshold grossly  intact ? ?Musculoskeletal Exam: There is some tenderness to palpation along the fifth metatarsal tubercle of the right foot consistent with a insertional peroneal tendinitis strain ? ?Radiographic Exam:  ?Normal osseous mineralization. Joint spaces preserved. No fracture/dislocation/boney destruction.   ? ?Assessment: ?1.  Insertional peroneal tendinitis right ? ? ?Plan of Care:  ?1. Patient evaluated. X-Rays reviewed.  ?2.  Injection of 0.5 cc Celestone Soluspan injected into the lateral aspect of the right foot ?3.  Continue meloxicam 15 mg daily as needed ?4.  The patient is not limping enough to justify cam boot.  Cam boot declined today. ?5.  Compression ankle sleeve dispensed.  Wear daily with good supportive sneakers or tennis shoes ?6.  Return to clinic as needed ? ?  ?  ?Edrick Kins, DPM ?[place] ? ?Dr. Edrick Kins, DPM  ?  ?[address]                                        ?[place], [place] [address]                ?Office [phone]  ?Fax [phone] ? ? ? ? ?

## 2022-03-01 ENCOUNTER — Other Ambulatory Visit: Payer: Self-pay | Admitting: Sports Medicine

## 2022-03-01 ENCOUNTER — Ambulatory Visit
Admission: RE | Admit: 2022-03-01 | Discharge: 2022-03-01 | Disposition: A | Discharge status: Home / Self Care | Payer: 59 | Source: Ambulatory Visit | Attending: Sports Medicine | Admitting: Sports Medicine

## 2022-03-01 DIAGNOSIS — M25551 Pain in right hip: Secondary | ICD-10-CM

## 2023-09-17 ENCOUNTER — Other Ambulatory Visit: Payer: Self-pay

## 2023-09-17 ENCOUNTER — Emergency Department (HOSPITAL_BASED_OUTPATIENT_CLINIC_OR_DEPARTMENT_OTHER): Admitting: Radiology

## 2023-09-17 ENCOUNTER — Encounter (HOSPITAL_BASED_OUTPATIENT_CLINIC_OR_DEPARTMENT_OTHER): Payer: Self-pay

## 2023-09-17 ENCOUNTER — Emergency Department (HOSPITAL_BASED_OUTPATIENT_CLINIC_OR_DEPARTMENT_OTHER)
Admission: EM | Admit: 2023-09-17 | Discharge: 2023-09-17 | Disposition: A | Discharge status: Home / Self Care | Attending: Emergency Medicine | Admitting: Emergency Medicine

## 2023-09-17 DIAGNOSIS — R0789 Other chest pain: Secondary | ICD-10-CM | POA: Insufficient documentation

## 2023-09-17 DIAGNOSIS — R079 Chest pain, unspecified: Secondary | ICD-10-CM

## 2023-09-17 DIAGNOSIS — R0602 Shortness of breath: Secondary | ICD-10-CM | POA: Insufficient documentation

## 2023-09-17 DIAGNOSIS — D72829 Elevated white blood cell count, unspecified: Secondary | ICD-10-CM | POA: Insufficient documentation

## 2023-09-17 LAB — CBC
HCT: 54.7 % — ABNORMAL HIGH (ref 39.0–52.0)
Hemoglobin: 19.1 g/dL — ABNORMAL HIGH (ref 13.0–17.0)
MCH: 32.7 pg (ref 26.0–34.0)
MCHC: 34.9 g/dL (ref 30.0–36.0)
MCV: 93.7 fL (ref 80.0–100.0)
Platelets: 164 10*3/uL (ref 150–400)
RBC: 5.84 MIL/uL — ABNORMAL HIGH (ref 4.22–5.81)
RDW: 13.2 % (ref 11.5–15.5)
WBC: 10.8 10*3/uL — ABNORMAL HIGH (ref 4.0–10.5)
nRBC: 0 % (ref 0.0–0.2)

## 2023-09-17 LAB — D-DIMER, QUANTITATIVE: D-Dimer, Quant: <0.27 ug{FEU}/mL (ref 0.00–0.50)

## 2023-09-17 LAB — TROPONIN T, HIGH SENSITIVITY
Troponin T High Sensitivity: <15 ng/L (ref <19)
Troponin T High Sensitivity: <15 ng/L (ref <19)

## 2023-09-17 LAB — BASIC METABOLIC PANEL WITH GFR
Anion gap: 13 (ref 5–15)
BUN: 17 mg/dL (ref 6–20)
CO2: 21 mmol/L — ABNORMAL LOW (ref 22–32)
Calcium: 9.4 mg/dL (ref 8.9–10.3)
Chloride: 103 mmol/L (ref 98–111)
Creatinine, Ser: 1.1 mg/dL (ref 0.61–1.24)
GFR, Estimated: >60 mL/min (ref >60)
Glucose, Bld: 74 mg/dL (ref 70–99)
Potassium: 4.1 mmol/L (ref 3.5–5.1)
Sodium: 137 mmol/L (ref 135–145)

## 2023-09-17 NOTE — ED Provider Notes (Signed)
 [place] EMERGENCY DEPARTMENT AT [facility] Provider Note   CSN: [id number] Arrival date & time: 09/17/23  1713     History {Add pertinent medical, surgical, social history, OB history to HPI:1} Chief Complaint  Patient presents with   Chest Pain    Joshua [name] is a 58 y.o. male.  He is here with a complaint of some right central chest pain feeling like a pressure sitting on his chest that began around 130 this morning.  It was on and off throughout the day and after he finished work he decided to come here for further evaluation.  He said he has been having a lot of belching and feels a little short of breath.  He has no prior history of cardiac disease but does have a family history.  He works as a Naval architect.  No cough or abdominal pain vomiting or diarrhea.  He says he feels a little constipated and bloated.   Chest Pain Pain location:  Substernal area and R chest Pain quality: aching and pressure   Pain radiates to:  Does not radiate Pain severity:  Moderate Duration:  1 day Timing:  Intermittent Progression:  Improving Chronicity:  New Relieved by:  None tried Associated symptoms: shortness of breath   Associated symptoms: no abdominal pain, no cough, no diaphoresis, no fever, no nausea, no syncope and no vomiting   Risk factors: male sex and smoking        Home Medications Prior to Admission medications   Medication Sig Start Date End Date Taking? Authorizing Provider  albuterol  (VENTOLIN  HFA) 108 (90 Base) MCG/ACT inhaler SMARTSIG:1-2 Puff(s) By Mouth Every 4 Hours PRN 03/21/21   [provider]  diazepam  (VALIUM ) 5 MG tablet Take 1 tablet (5 mg total) by mouth every 6 (six) hours as needed for muscle spasms (difficulty urinating). 08/21/20   Candyce Champagne, MD  fenofibrate  54 MG tablet Take 54 mg by mouth at bedtime.    [provider]  meloxicam  (MOBIC ) 15 MG tablet Take 1 tablet (15 mg total) by mouth daily. 08/05/21   Dot Gazella,  DPM  oxyCODONE  (OXY IR/ROXICODONE ) 5 MG immediate release tablet Take 1-2 tablets (5-10 mg total) by mouth every 6 (six) hours as needed for moderate pain, severe pain or breakthrough pain. 08/21/20   Candyce Champagne, MD  testosterone  cypionate (DEPOTESTOSTERONE CYPIONATE) 200 MG/ML injection Inject 200 mg into the muscle every 14 (fourteen) days. 05/21/19   [provider]  traZODone  (DESYREL ) 150 MG tablet Take 150 mg by mouth at bedtime. 03/23/19   [provider]      Allergies    Oxycodone  hcl and Penicillins    Review of Systems   Review of Systems  Constitutional:  Negative for diaphoresis and fever.  Respiratory:  Positive for shortness of breath. Negative for cough.   Cardiovascular:  Positive for chest pain. Negative for syncope.  Gastrointestinal:  Negative for abdominal pain, nausea and vomiting.    Physical Exam Updated Vital Signs BP (!) 150/89   Pulse 69   Temp 98.2 F (36.8 C) (Oral)   Resp (!) 22   SpO2 94%  Physical Exam Vitals and nursing note reviewed.  Constitutional:      Appearance: He is well-developed.  HENT:     Head: Normocephalic and atraumatic.  Eyes:     Conjunctiva/sclera: Conjunctivae normal.  Cardiovascular:     Rate and Rhythm: Normal rate and regular rhythm.     Heart sounds: Normal heart  sounds. No murmur heard. Pulmonary:     Effort: Pulmonary effort is normal. No respiratory distress.     Breath sounds: Normal breath sounds.  Abdominal:     Palpations: Abdomen is soft.     Tenderness: There is no abdominal tenderness.  Musculoskeletal:     Cervical back: Neck supple.     Right lower leg: No tenderness. No edema.     Left lower leg: No tenderness. No edema.  Skin:    General: Skin is warm and dry.  Neurological:     General: No focal deficit present.     Mental Status: He is alert.     GCS: GCS eye subscore is 4. GCS verbal subscore is 5. GCS motor subscore is 6.     ED Results / Procedures / Treatments    Labs (all labs ordered are listed, but only abnormal results are displayed) Labs Reviewed  BASIC METABOLIC PANEL WITH GFR - Abnormal; Notable for the following components:      Result Value   CO2 21 (*)    All other components within normal limits  CBC - Abnormal; Notable for the following components:   WBC 10.8 (*)    RBC 5.84 (*)    Hemoglobin 19.1 (*)    HCT 54.7 (*)    All other components within normal limits  TROPONIN T, HIGH SENSITIVITY  TROPONIN T, HIGH SENSITIVITY    EKG EKG Interpretation Date/Time:  Saturday Sep 17 2023 17:27:47 EDT Ventricular Rate:  75 PR Interval:  174 QRS Duration:  110 QT Interval:  350 QTC Calculation: 390 R Axis:   43  Text Interpretation: Normal sinus rhythm Nonspecific ST abnormality Abnormal ECG When compared with ECG of 31-Dec-2019 01:44, No significant change since last tracing Confirmed by Racheal Buddle [phone]) on 09/17/2023 5:33:36 PM  Radiology DG Chest 2 View Result Date: 09/17/2023 CLINICAL DATA:  Right-sided chest pain. EXAM: CHEST - 2 VIEW COMPARISON:  02/04/2020 and CT on 01/01/2019 FINDINGS: The heart size and mediastinal contours are within normal limits. Previously seen diffuse interstitial infiltrates have nearly completely resolved. Calcified pleural plaque is again seen in the right anterior hemithorax, as demonstrated on previous CT. No evidence of acute infiltrate or pleural effusion. IMPRESSION: Calcified pleural plaque in right hemithorax. No active lung disease. Electronically Signed   By: Marlyce Sine M.D.   On: 09/17/2023 17:54    Procedures Procedures  {Document cardiac monitor, telemetry assessment procedure when appropriate:1}  Medications Ordered in ED Medications - No data to display  ED Course/ Medical Decision Making/ A&P   {   Click here for ABCD2, HEART and other calculatorsREFRESH Note before signing :1}                              Medical Decision Making Amount and/or Complexity of Data  Reviewed Labs: ordered. Radiology: ordered.   This patient complains of ***; this involves an extensive number of treatment Options and is a complaint that carries with it a high risk of complications and morbidity. The differential includes ***  I ordered, reviewed and interpreted labs, which included *** I ordered medication *** and reviewed PMP when indicated. I ordered imaging studies which included *** and I independently    visualized and interpreted imaging which showed *** Additional history obtained from *** Previous records obtained and reviewed *** I consulted *** and discussed lab and imaging findings and discussed disposition.  Cardiac monitoring reviewed, ***  Social determinants considered, *** Critical Interventions: ***  After the interventions stated above, I reevaluated the patient and found *** Admission and further testing considered, ***   {Document critical care time when appropriate:1} {Document review of labs and clinical decision tools ie heart score, Chads2Vasc2 etc:1}  {Document your independent review of radiology images, and any outside records:1} {Document your discussion with family members, caretakers, and with consultants:1} {Document social determinants of health affecting pt's care:1} {Document your decision making why or why not admission, treatments were needed:1} Final Clinical Impression(s) / ED Diagnoses Final diagnoses:  None    Rx / DC Orders ED Discharge Orders     None

## 2023-09-17 NOTE — ED Triage Notes (Signed)
 He c/o right-sided chest pain, intermittently since this morning. He also c/o constipation and feeling "bloated in my belly". He also mentions a globus throat sensation today. His skin is normal, warm and dry and he is breathing normally.

## 2023-09-17 NOTE — Discharge Instructions (Signed)
 You are seen in the emergency department for some chest pain and shortness of breath.  You had blood work EKG and chest x-ray that did not show an obvious explanation for your symptoms.  We are putting you in for cardiology referral.  Please take a baby aspirin daily.  Return to the emergency department if any worsening or concerning symptoms

## 2023-09-28 ENCOUNTER — Emergency Department (HOSPITAL_COMMUNITY)
Admission: EM | Admit: 2023-09-28 | Discharge: 2023-09-28 | Disposition: A | Discharge status: Home / Self Care | Attending: Emergency Medicine | Admitting: Emergency Medicine

## 2023-09-28 ENCOUNTER — Other Ambulatory Visit: Payer: Self-pay

## 2023-09-28 ENCOUNTER — Emergency Department (HOSPITAL_COMMUNITY)

## 2023-09-28 ENCOUNTER — Encounter (HOSPITAL_COMMUNITY): Payer: Self-pay

## 2023-09-28 DIAGNOSIS — D751 Secondary polycythemia: Secondary | ICD-10-CM | POA: Diagnosis not present

## 2023-09-28 DIAGNOSIS — R079 Chest pain, unspecified: Secondary | ICD-10-CM | POA: Insufficient documentation

## 2023-09-28 DIAGNOSIS — M546 Pain in thoracic spine: Secondary | ICD-10-CM | POA: Insufficient documentation

## 2023-09-28 DIAGNOSIS — R059 Cough, unspecified: Secondary | ICD-10-CM | POA: Insufficient documentation

## 2023-09-28 DIAGNOSIS — Z87442 Personal history of urinary calculi: Secondary | ICD-10-CM | POA: Insufficient documentation

## 2023-09-28 LAB — URINALYSIS, ROUTINE W REFLEX MICROSCOPIC
Bacteria, UA: NONE SEEN
Bilirubin Urine: NEGATIVE
Glucose, UA: NEGATIVE mg/dL
Hgb urine dipstick: NEGATIVE
Ketones, ur: NEGATIVE mg/dL
Nitrite: NEGATIVE
Protein, ur: NEGATIVE mg/dL
Specific Gravity, Urine: 1.018 (ref 1.005–1.030)
pH: 5 (ref 5.0–8.0)

## 2023-09-28 LAB — CBC
HCT: 56.5 % — ABNORMAL HIGH (ref 39.0–52.0)
Hemoglobin: 19.5 g/dL — ABNORMAL HIGH (ref 13.0–17.0)
MCH: 33.1 pg (ref 26.0–34.0)
MCHC: 34.5 g/dL (ref 30.0–36.0)
MCV: 95.9 fL (ref 80.0–100.0)
Platelets: 157 10*3/uL (ref 150–400)
RBC: 5.89 MIL/uL — ABNORMAL HIGH (ref 4.22–5.81)
RDW: 13.3 % (ref 11.5–15.5)
WBC: 10.8 10*3/uL — ABNORMAL HIGH (ref 4.0–10.5)
nRBC: 0 % (ref 0.0–0.2)

## 2023-09-28 LAB — BASIC METABOLIC PANEL WITH GFR
Anion gap: 10 (ref 5–15)
BUN: 16 mg/dL (ref 6–20)
CO2: 22 mmol/L (ref 22–32)
Calcium: 8.8 mg/dL — ABNORMAL LOW (ref 8.9–10.3)
Chloride: 104 mmol/L (ref 98–111)
Creatinine, Ser: 1.22 mg/dL (ref 0.61–1.24)
GFR, Estimated: >60 mL/min (ref >60)
Glucose, Bld: 89 mg/dL (ref 70–99)
Potassium: 4.4 mmol/L (ref 3.5–5.1)
Sodium: 136 mmol/L (ref 135–145)

## 2023-09-28 MED ORDER — DOXYCYCLINE HYCLATE 100 MG PO CAPS: 100.0000 mg | ORAL_CAPSULE | Freq: Two times a day (BID) | ORAL | 0 refills | Status: DC

## 2023-09-28 MED ORDER — PREDNISONE 50 MG PO TABS: 50.0000 mg | ORAL_TABLET | Freq: Every day | ORAL | 0 refills | Status: DC

## 2023-09-28 MED ORDER — KETOROLAC TROMETHAMINE 30 MG/ML IJ SOLN
30.0000 mg | Freq: Once | INTRAMUSCULAR | Status: AC
  Administered 2023-09-28: 30 mg via INTRAVENOUS
  Filled 2023-09-28: qty 1

## 2023-09-28 MED ORDER — DEXAMETHASONE SODIUM PHOSPHATE 10 MG/ML IJ SOLN
10.0000 mg | Freq: Once | INTRAMUSCULAR | Status: AC
  Administered 2023-09-28: 10 mg via INTRAVENOUS
  Filled 2023-09-28: qty 1

## 2023-09-28 MED ORDER — ONDANSETRON HCL 4 MG/2ML IJ SOLN
4.0000 mg | Freq: Once | INTRAMUSCULAR | Status: AC
  Administered 2023-09-28: 4 mg via INTRAVENOUS
  Filled 2023-09-28: qty 2

## 2023-09-28 MED ORDER — IOHEXOL 350 MG/ML SOLN
75.0000 mL | Freq: Once | INTRAVENOUS | Status: AC | PRN
  Administered 2023-09-28: 75 mL via INTRAVENOUS

## 2023-09-28 MED ORDER — DOXYCYCLINE HYCLATE 100 MG PO TABS
100.0000 mg | ORAL_TABLET | Freq: Once | ORAL | Status: AC
  Administered 2023-09-28: 100 mg via ORAL
  Filled 2023-09-28: qty 1

## 2023-09-28 MED ORDER — MORPHINE SULFATE (PF) 4 MG/ML IV SOLN
4.0000 mg | Freq: Once | INTRAVENOUS | Status: AC
  Administered 2023-09-28: 4 mg via INTRAVENOUS
  Filled 2023-09-28: qty 1

## 2023-09-28 MED ORDER — HYDROCODONE-ACETAMINOPHEN 5-325 MG PO TABS: 1.0000 | ORAL_TABLET | ORAL | 0 refills | Status: DC | PRN

## 2023-09-28 MED ORDER — SODIUM CHLORIDE 0.9 % IV BOLUS
1000.0000 mL | Freq: Once | INTRAVENOUS | Status: AC
  Administered 2023-09-28: 1000 mL via INTRAVENOUS

## 2023-09-28 NOTE — ED Provider Notes (Signed)
 [place] EMERGENCY DEPARTMENT AT [facility] Provider Note   CSN: [id number] Arrival date & time: 09/28/23  1354     History  Chief Complaint  Patient presents with   Flank Pain    Joshua [name] is a 58 y.o. male.  Pt is a 59 yo male with pmhx significant for anal fistula, rls, kidney stones, testosterone  deficiency (receives testosterone  injections) and plantar fasciitis.  Pt has had right upper abd pain, lower right cp and right upper back pain for 2 days.  He went to UC who did an xray which showed a stone.  He was told to come to the ED for further eval.  He has back pain with movement.  He has pain in her chest and abd when he coughs.  He is concerned he may have pna as he was told his severe covid caused scarring which would cause him to get pna.  No fevers.  He has had a cough.  Previous hx of appendectomy and cholecystectomy.       Home Medications Prior to Admission medications   Medication Sig Start Date End Date Taking? Authorizing Provider  albuterol  (VENTOLIN  HFA) 108 (90 Base) MCG/ACT inhaler SMARTSIG:1-2 Puff(s) By Mouth Every 4 Hours PRN 03/21/21   [provider]  diazepam  (VALIUM ) 5 MG tablet Take 1 tablet (5 mg total) by mouth every 6 (six) hours as needed for muscle spasms (difficulty urinating). 08/21/20   Candyce Champagne, MD  doxycycline (VIBRAMYCIN) 100 MG capsule Take 1 capsule (100 mg total) by mouth 2 (two) times daily. 09/28/23  Yes Sueellen Emery, MD  fenofibrate  54 MG tablet Take 54 mg by mouth at bedtime.    [provider]  HYDROcodone-acetaminophen  (NORCO/VICODIN) 5-325 MG tablet Take 1 tablet by mouth every 4 (four) hours as needed. 09/28/23  Yes Sueellen Emery, MD  meloxicam  (MOBIC ) 15 MG tablet Take 1 tablet (15 mg total) by mouth daily. 08/05/21   Dot Gazella, DPM  oxyCODONE  (OXY IR/ROXICODONE ) 5 MG immediate release tablet Take 1-2 tablets (5-10 mg total) by mouth every 6 (six) hours as needed for moderate pain, severe  pain or breakthrough pain. 08/21/20   Candyce Champagne, MD  predniSONE  (DELTASONE ) 50 MG tablet Take 1 tablet (50 mg total) by mouth daily with breakfast. 09/28/23  Yes Sueellen Emery, MD  testosterone  cypionate (DEPOTESTOSTERONE CYPIONATE) 200 MG/ML injection Inject 200 mg into the muscle every 14 (fourteen) days. 05/21/19   [provider]  traZODone  (DESYREL ) 150 MG tablet Take 150 mg by mouth at bedtime. 03/23/19   [provider]      Allergies    Oxycodone  hcl and Penicillins    Review of Systems   Review of Systems  Cardiovascular:  Positive for chest pain.  Gastrointestinal:  Positive for abdominal pain.  Musculoskeletal:  Positive for back pain.  All other systems reviewed and are negative.   Physical Exam Updated Vital Signs BP (!) 156/103   Pulse 85   Temp 97.9 F (36.6 C) (Oral)   Resp 20   Ht 5' 10 (1.778 m)   Wt 121.1 kg   SpO2 99%   BMI 38.31 kg/m  Physical Exam Vitals and nursing note reviewed.  Constitutional:      Appearance: Normal appearance. He is obese.  HENT:     Head: Normocephalic and atraumatic.     Right Ear: External ear normal.     Left Ear: External ear normal.     Nose: Nose normal.  Mouth/Throat:     Mouth: Mucous membranes are moist.     Pharynx: Oropharynx is clear.  Eyes:     Extraocular Movements: Extraocular movements intact.     Conjunctiva/sclera: Conjunctivae normal.     Pupils: Pupils are equal, round, and reactive to light.  Cardiovascular:     Rate and Rhythm: Normal rate and regular rhythm.     Pulses: Normal pulses.     Heart sounds: Normal heart sounds.  Pulmonary:     Effort: Pulmonary effort is normal.     Breath sounds: Normal breath sounds.  Abdominal:     General: Abdomen is flat. Bowel sounds are normal.     Palpations: Abdomen is soft.     Tenderness: There is abdominal tenderness in the right upper quadrant.  Musculoskeletal:     Cervical back: Normal range of motion and neck supple.        Back:  Skin:    General: Skin is warm.     Capillary Refill: Capillary refill takes less than 2 seconds.  Neurological:     General: No focal deficit present.     Mental Status: He is alert and oriented to person, place, and time.  Psychiatric:        Mood and Affect: Mood normal.        Behavior: Behavior normal.     ED Results / Procedures / Treatments   Labs (all labs ordered are listed, but only abnormal results are displayed) Labs Reviewed  URINALYSIS, ROUTINE W REFLEX MICROSCOPIC - Abnormal; Notable for the following components:      Result Value   Leukocytes,Ua SMALL (*)    All other components within normal limits  BASIC METABOLIC PANEL WITH GFR - Abnormal; Notable for the following components:   Calcium 8.8 (*)    All other components within normal limits  CBC - Abnormal; Notable for the following components:   WBC 10.8 (*)    RBC 5.89 (*)    Hemoglobin 19.5 (*)    HCT 56.5 (*)    All other components within normal limits  JAK2 GENOTYPR  ERYTHROPOIETIN    EKG None  Radiology CT Renal Stone Study Result Date: 09/28/2023 EXAM:  CT ABDOMEN PELVIS WITHOUT IV CONTRAST RENAL CALCULI INDICATION:  Abdominal/flank pain, stone suspected TECHNIQUE: Spiral CT scanning was performed through the abdomen and pelvis with no oral or IV contrast. FINDINGS: Diffuse hepatic steatosis is present. The gallbladder is surgically absent. The spleen, pancreas, and adrenal glands have a normal appearance. A 5 x 10 mm calculus is present in the lower pole of the right kidney. No other renal calculi are identified. No renal mass is visualized. No hydronephrosis is present. No ureteral calculi are visualized. There is no abdominal aortic aneurysm. The bowel loops are grossly unremarkable. No calculi are identified in the pelvic ureters or in the urinary bladder. There is no evidence of bladder mass. No abnormality is identified involving the prostate or seminal vesicles. The pelvic bowel loops are  grossly unremarkable. No ascites or adenopathy is identified. There is no fracture or bone destruction. IMPRESSION: 1. No evidence of ureteral calculus or obstruction. 2. Nonobstructive right renal calculus as described above. Please note that CT scanning at this site utilizes multiple dose reduction techniques, including automatic exposure control adjustment of the MAA and/or KVP according to the patient's size, and use of iterative reconstruction technique. Electronically signed by: Buster Cash MD 09/28/2023 04:28 PM EDT RP Workstation: [id number]   CT Angio Chest  PE W and/or Wo Contrast Result Date: 09/28/2023 EXAMINATION: CTA CHEST PE CLINICAL INDICATION: Pulmonary embolism (PE) suspected, high prob TECHNIQUE: This examination was performed according to an angiographic protocol with 3D post-processing. This involves 3D reconstructions, MIPs, volume rendered images and/or shaded surface rendering. One or more of the following dose reduction techniques were used: Automated exposure control, adjustment of the mA and/or kV according to patient size, and/or iterative reconstruction. Unless otherwise specified, incidental findings do not require dedicated imaging follow-up. COMPARISON: 01/01/2020 FINDINGS: The pulmonary arteries are well-opacified. No intraluminal filling defects are identified. There is no thoracic aortic aneurysm. The cardiac size is within normal limits. Moderate coronary calcification is present. No pericardial effusion is present. There is no pleural effusion. Stable pleural calcification is present in the right anterior chest. No adenopathy is identified in the chest. The thyroid gland has a normal appearance. The esophagus is within normal limits. A mosaic attenuation pattern is present in the lungs. There are patchy areas of groundglass attenuation which are less extensive and prominent when compared to the previous CT scan. No pulmonary mass is identified. No significant abnormality is  identified in the upper abdomen. There is no acute fracture or destructive process. IMPRESSION: 1. No evidence of pulmonary embolism. 2. Mosaic attenuation pattern in the lungs, consistent with small airways disease. 3. Mild, patchy groundglass attenuation within the lungs, nonspecific. Electronically signed by: Buster Cash MD 09/28/2023 04:25 PM EDT RP Workstation: [id number]    Procedures Procedures    Medications Ordered in ED Medications  dexamethasone  (DECADRON ) injection 10 mg (has no administration in time range)  doxycycline (VIBRA-TABS) tablet 100 mg (has no administration in time range)  sodium chloride  0.9 % bolus 1,000 mL (1,000 mLs Intravenous New Bag/Given 09/28/23 1423)  morphine  (PF) 4 MG/ML injection 4 mg (4 mg Intravenous Given 09/28/23 1451)  ondansetron  (ZOFRAN ) injection 4 mg (4 mg Intravenous Given 09/28/23 1451)  ketorolac (TORADOL) 30 MG/ML injection 30 mg (30 mg Intravenous Given 09/28/23 1451)  iohexol  (OMNIPAQUE ) 350 MG/ML injection 75 mL (75 mLs Intravenous Contrast Given 09/28/23 1550)  morphine  (PF) 4 MG/ML injection 4 mg (4 mg Intravenous Given 09/28/23 1626)    ED Course/ Medical Decision Making/ A&P                                 Medical Decision Making Amount and/or Complexity of Data Reviewed Labs: ordered. Radiology: ordered.  Risk Prescription drug management.   This patient presents to the ED for concern of flank pain, this involves an extensive number of treatment options, and is a complaint that carries with it a high risk of complications and morbidity.  The differential diagnosis includes kidney stone, uti, electrolyte abn   Co morbidities that complicate the patient evaluation  anal fistula, rls, and plantar fasciitis   Additional history obtained:  Additional history obtained from epic chart review  Lab Tests:  I Ordered, and personally interpreted labs.  The pertinent results include:  cbc with hgb 10.8, hct elevated at 19.5 (19.1  on 5/31 and 17.3 in 2022); urine nl; bmp nl   Imaging Studies ordered:  I ordered imaging studies including ct renal and ct chest I independently visualized and interpreted imaging which showed  CT chest:  No evidence of pulmonary embolism.  2. Mosaic attenuation pattern in the lungs, consistent with small airways  disease.  3. Mild, patchy groundglass attenuation within the lungs, nonspecific.  CT renal: No  evidence of ureteral calculus or obstruction.  2. Nonobstructive right renal calculus as described above.   I agree with the radiologist interpretation   Cardiac Monitoring:  The patient was maintained on a cardiac monitor.  I personally viewed and interpreted the cardiac monitored which showed an underlying rhythm of: nsr   Medicines ordered and prescription drug management:  I ordered medication including ivfs/morphine /zofran /toradol  for sx  Reevaluation of the patient after these medicines showed that the patient improved I have reviewed the patients home medicines and have made adjustments as needed   Test Considered:  ct   Problem List / ED Course:  Erythrocytosis:  evident on labs for several years.  No formal dx of PV, but he said he's been told to give blood.  He usually gives every 3 months.  Labs for JAK2 gene and epo level sent. Pt referred to heme-onc. HTN:  bp elevated here.  BP slightly elevated on 5/31 as well.  Pt told to f/u with pcp for a bp recheck. Right flank pain:  likely from bronchitis/pleurisy.  No evidence of PE or PNA.   Reevaluation:  After the interventions noted above, I reevaluated the patient and found that they have :improved   Social Determinants of Health:  Lives at home   Dispostion:  After consideration of the diagnostic results and the patients response to treatment, I feel that the patent would benefit from discharge with outpatient f/u.          Final Clinical Impression(s) / ED Diagnoses Final diagnoses:   Erythrocytosis    Rx / DC Orders ED Discharge Orders          Ordered    Ambulatory referral to Hematology / Oncology        09/28/23 1627    doxycycline (VIBRAMYCIN) 100 MG capsule  2 times daily        09/28/23 1644    HYDROcodone-acetaminophen  (NORCO/VICODIN) 5-325 MG tablet  Every 4 hours PRN        09/28/23 1644    predniSONE  (DELTASONE ) 50 MG tablet  Daily with breakfast        09/28/23 1644              Sueellen Emery, MD 09/28/23 1645

## 2023-09-28 NOTE — ED Notes (Signed)
 Pt verbalized understanding of discharge instructions. Opportunity for questions provided.   Pt made aware to start doxycycline and prednisone  tomorrow.

## 2023-09-28 NOTE — ED Triage Notes (Signed)
 Pt c/o flank pain that started about two days ago, went to UC today and sent over due to kidney stone. No problems urinating.

## 2023-09-28 NOTE — Discharge Instructions (Addendum)
 Your blood pressure was elevated here.  This could be due to pain, but follow up with your PCP for a recheck.

## 2023-09-29 LAB — JAK2 GENOTYPR

## 2023-09-30 LAB — ERYTHROPOIETIN: Erythropoietin: 3.9 m[IU]/mL (ref 2.6–18.5)

## 2023-10-24 ENCOUNTER — Inpatient Hospital Stay

## 2023-10-24 ENCOUNTER — Inpatient Hospital Stay: Attending: Hematology and Oncology | Admitting: Hematology and Oncology

## 2023-10-24 VITALS — BP 148/97 | HR 79 | Temp 98.0°F | Resp 14 | Wt 268.0 lb

## 2023-10-24 DIAGNOSIS — D751 Secondary polycythemia: Secondary | ICD-10-CM | POA: Diagnosis not present

## 2023-10-24 DIAGNOSIS — Z8616 Personal history of COVID-19: Secondary | ICD-10-CM | POA: Diagnosis not present

## 2023-10-24 DIAGNOSIS — Z7982 Long term (current) use of aspirin: Secondary | ICD-10-CM | POA: Diagnosis not present

## 2023-10-24 DIAGNOSIS — E291 Testicular hypofunction: Secondary | ICD-10-CM | POA: Insufficient documentation

## 2023-10-24 DIAGNOSIS — E785 Hyperlipidemia, unspecified: Secondary | ICD-10-CM | POA: Diagnosis not present

## 2023-10-24 DIAGNOSIS — G2581 Restless legs syndrome: Secondary | ICD-10-CM | POA: Insufficient documentation

## 2023-10-24 DIAGNOSIS — Z809 Family history of malignant neoplasm, unspecified: Secondary | ICD-10-CM | POA: Diagnosis not present

## 2023-10-24 DIAGNOSIS — Z79899 Other long term (current) drug therapy: Secondary | ICD-10-CM | POA: Insufficient documentation

## 2023-10-24 DIAGNOSIS — M109 Gout, unspecified: Secondary | ICD-10-CM | POA: Diagnosis not present

## 2023-10-24 LAB — CBC WITH DIFFERENTIAL (CANCER CENTER ONLY)
Abs Immature Granulocytes: 0.03 K/uL (ref 0.00–0.07)
Basophils Absolute: 0 K/uL (ref 0.0–0.1)
Basophils Relative: 0 %
Eosinophils Absolute: 0.3 K/uL (ref 0.0–0.5)
Eosinophils Relative: 4 %
HCT: 50.8 % (ref 39.0–52.0)
Hemoglobin: 18.1 g/dL — ABNORMAL HIGH (ref 13.0–17.0)
Immature Granulocytes: 0 %
Lymphocytes Relative: 18 %
Lymphs Abs: 1.6 K/uL (ref 0.7–4.0)
MCH: 32.3 pg (ref 26.0–34.0)
MCHC: 35.6 g/dL (ref 30.0–36.0)
MCV: 90.6 fL (ref 80.0–100.0)
Monocytes Absolute: 0.6 K/uL (ref 0.1–1.0)
Monocytes Relative: 7 %
Neutro Abs: 6.3 K/uL (ref 1.7–7.7)
Neutrophils Relative %: 71 %
Platelet Count: 129 K/uL — ABNORMAL LOW (ref 150–400)
RBC: 5.61 MIL/uL (ref 4.22–5.81)
RDW: 12.8 % (ref 11.5–15.5)
WBC Count: 8.9 K/uL (ref 4.0–10.5)
nRBC: 0 % (ref 0.0–0.2)

## 2023-10-24 LAB — IRON AND IRON BINDING CAPACITY (CC-WL,HP ONLY)
Iron: 167 ug/dL (ref 45–182)
Saturation Ratios: 58 % — ABNORMAL HIGH (ref 17.9–39.5)
TIBC: 287 ug/dL (ref 250–450)
UIBC: 120 ug/dL (ref 117–376)

## 2023-10-24 LAB — CMP (CANCER CENTER ONLY)
ALT: 19 U/L (ref 0–44)
AST: 19 U/L (ref 15–41)
Albumin: 4.1 g/dL (ref 3.5–5.0)
Alkaline Phosphatase: 36 U/L — ABNORMAL LOW (ref 38–126)
Anion gap: 5 (ref 5–15)
BUN: 12 mg/dL (ref 6–20)
CO2: 25 mmol/L (ref 22–32)
Calcium: 9 mg/dL (ref 8.9–10.3)
Chloride: 108 mmol/L (ref 98–111)
Creatinine: 1.08 mg/dL (ref 0.61–1.24)
GFR, Estimated: >60 mL/min (ref >60)
Glucose, Bld: 91 mg/dL (ref 70–99)
Potassium: 4.3 mmol/L (ref 3.5–5.1)
Sodium: 138 mmol/L (ref 135–145)
Total Bilirubin: 0.9 mg/dL (ref 0.0–1.2)
Total Protein: 6.5 g/dL (ref 6.5–8.1)

## 2023-10-24 LAB — RETIC PANEL
Immature Retic Fract: 8.7 % (ref 2.3–15.9)
RBC.: 5.57 MIL/uL (ref 4.22–5.81)
Retic Count, Absolute: 117.5 K/uL (ref 19.0–186.0)
Retic Ct Pct: 2.1 % (ref 0.4–3.1)
Reticulocyte Hemoglobin: 36.9 pg (ref >27.9)

## 2023-10-24 LAB — FERRITIN: Ferritin: 404 ng/mL — ABNORMAL HIGH (ref 24–336)

## 2023-10-24 NOTE — Progress Notes (Signed)
 [facility] [phone]   [phone]  INITIAL CONSULT NOTE  Patient Care Team: Nanci Senior, MD as PCP - General (Family Medicine) Wendel Lurena POUR, MD as PCP - Cardiology (Cardiology) Sheldon Standing, MD as Consulting Physician (General Surgery) Saintclair Jasper, MD as Consulting Physician (Gastroenterology) Lelon Glendia ONEIDA DEVONNA as Physician Assistant (Cardiology)  Hematological/Oncological History # Polycythemia 10/24/2023: Establish care with Dr. Federico  CHIEF COMPLAINTS/PURPOSE OF CONSULTATION:  Polycythemia   HISTORY OF PRESENTING ILLNESS:  Joshua [name] 58 y.o. [name] with medical history significant for hypogonadism on testosterone  supplementation, gout, plantar fasciitis, and restless leg syndrome who presents for evaluation of polycythemia in the setting of testosterone  use.  On review of the previous records Joshua [name] had labs collected on 09/17/2023 which showed white blood cell 10.8, hemoglobin 19.1, MCV 93.7, hematocrit 54.7, platelets 164  On exam today Joshua [name] reports that he is aware that the testosterone  can increase the levels in his blood.  He typically donates blood every 4 months or so but has been 5 months since his last blood donation.  He reports that he does not smoke and had a sleep apnea test before in the past which was negative.  He does suffer however from restless leg syndrome.  He reports that he does not have any issues with itching after hot showers, plethora, or other issues with swelling.  He has no prior history of VTE.  On further discussion he reports his mother died of a heart attack and his father died of lung cancer.  He had 2 uncles who had strokes.  His paternal grandfather had a heart attack.  He has 4 children, 1 of whom passed away from cystic fibrosis.  He reports that he does enjoy occasional alcohol and drinks about 4 beers on the weekend.  He currently works as a Naval architect.  He notes that he does  take aspirin  81 mg p.o. daily.  He reports that his wife is the one who gives him the testosterone  shot.  He otherwise denies any fevers, chills, sweats, nausea, vomiting or diarrhea.  A full 10 point ROS is otherwise negative.  MEDICAL HISTORY:  Past Medical History:  Diagnosis Date   Anal fistula    History of 2019 novel coronavirus disease (COVID-19) 12/31/2019   positive test in epic, pt hospital admission with acute respiratory failure, sirs, sepsis with viral pneumonia (08-15-2020  per pt went home on O2 for 2 months, per pt all symptoms resolved dec 2021)   History of gout    Hyperlipidemia    Triglycerides high - Statin Rx stopped due to myalgias   Hypogonadism in [name]    Perirectal abscess 06/28/2019   Plantar fasciitis, right    Polycythemia    Heme - Dr. Federico   RLS (restless legs syndrome)     SURGICAL HISTORY: Past Surgical History:  Procedure Laterality Date   APPENDECTOMY  2001   CHOLECYSTECTOMY  age 64s   EVALUATION UNDER ANESTHESIA WITH HEMORRHOIDECTOMY N/A 08/21/2020   Procedure: ANORECTAL EXAM UNDER ANESTHESIA WITH , REPAIR OF PERIRECTAL FISTULA;  Surgeon: Sheldon Standing, MD;  Location: [facility];  Service: General;  Laterality: N/A;   INCISION AND DRAINAGE PERIRECTAL ABSCESS N/A 06/28/2019   Procedure: IRRIGATION AND DEBRIDEMENT PERIRECTAL ABSCESS;  Surgeon: Sheldon Standing, MD;  Location: [facility];  Service: General;  Laterality: N/A;    SOCIAL HISTORY: Social History   Socioeconomic History   Marital status: Married    Spouse name:  Not on file   Number of children: 5   Years of education: Not on file   Highest education level: Not on file  Occupational History   Not on file  Tobacco Use   Smoking status: Never   Smokeless tobacco: Current    Types: Snuff  Vaping Use   Vaping status: Never Used  Substance and Sexual Activity   Alcohol use: Yes    Comment: occasional   Drug use: Never   Sexual activity: Not on file  Other Topics  Concern   Not on file  Social History Narrative   Not on file   Social Drivers of Health   Financial Resource Strain: Not on file  Food Insecurity: Food Insecurity Present (10/24/2023)   Hunger Vital Sign    Worried About Running Out of Food in the Last Year: Sometimes true    Ran Out of Food in the Last Year: Sometimes true  Transportation Needs: No Transportation Needs (10/24/2023)   PRAPARE - Administrator, Civil Service (Medical): No    Lack of Transportation (Non-Medical): No  Physical Activity: Not on file  Stress: Not on file  Social Connections: Unknown (08/31/2021)   [facility]   Social Network    Social Network: Not on file  Intimate Partner Violence: Not At Risk (10/24/2023)   Humiliation, Afraid, Rape, and Kick questionnaire    Fear of Current or Ex-Partner: No    Emotionally Abused: No    Physically Abused: No    Sexually Abused: No    FAMILY HISTORY: Family History  Problem Relation Age of Onset   Heart failure Mother 2   Diabetes Mother    Cancer Father    Diabetes Father    Heart attack Paternal Grandmother    Heart attack Paternal Grandfather     ALLERGIES:  is allergic to oxycodone  hcl and penicillins.  MEDICATIONS:  Current Outpatient Medications  Medication Sig Dispense Refill   albuterol  (VENTOLIN  HFA) 108 (90 Base) MCG/ACT inhaler SMARTSIG:1-2 Puff(s) By Mouth Every 4 Hours PRN (Patient taking differently: Inhale 1-2 puffs into the lungs as needed for wheezing or shortness of breath.)     metoprolol  tartrate (LOPRESSOR ) 100 MG tablet TAKE 1 TABLET BY MOUTH 2 HOURS BEFORE THE CARDIAC CT 1 tablet 0   testosterone  cypionate (DEPOTESTOSTERONE CYPIONATE) 200 MG/ML injection Inject 200 mg into the muscle every 14 (fourteen) days.     traZODone  (DESYREL ) 150 MG tablet Take 150 mg by mouth at bedtime.     Current Facility-Administered Medications  Medication Dose Route Frequency Provider Last Rate Last Admin   betamethasone   acetate-betamethasone  sodium phosphate  (CELESTONE ) injection 3 mg  3 mg Intra-articular Once Evans, Thresa HERO, DPM       betamethasone  acetate-betamethasone  sodium phosphate  (CELESTONE ) injection 3 mg  3 mg Intra-articular Once Evans, Brent M, DPM        REVIEW OF SYSTEMS:   Constitutional: ( - ) fevers, ( - )  chills , ( - ) night sweats Eyes: ( - ) blurriness of vision, ( - ) double vision, ( - ) watery eyes Ears, nose, mouth, throat, and face: ( - ) mucositis, ( - ) sore throat Respiratory: ( - ) cough, ( - ) dyspnea, ( - ) wheezes Cardiovascular: ( - ) palpitation, ( - ) chest discomfort, ( - ) lower extremity swelling Gastrointestinal:  ( - ) nausea, ( - ) heartburn, ( - ) change in bowel habits Skin: ( - )  abnormal skin rashes Lymphatics: ( - ) new lymphadenopathy, ( - ) easy bruising Neurological: ( - ) numbness, ( - ) tingling, ( - ) new weaknesses Behavioral/Psych: ( - ) mood change, ( - ) new changes  All other systems were reviewed with the patient and are negative.  PHYSICAL EXAMINATION:  Vitals:   10/24/23 0926  BP: (!) 148/97  Pulse: 79  Resp: 14  Temp: 98 F (36.7 C)  SpO2: 96%   Filed Weights   10/24/23 0926  Weight: 268 lb (121.6 kg)    GENERAL: well appearing middle-age Caucasian [name] in NAD  SKIN: skin color, texture, turgor are normal, no rashes or significant lesions EYES: conjunctiva are pink and non-injected, sclera clear LUNGS: clear to auscultation and percussion with normal breathing effort HEART: regular rate & rhythm and no murmurs and no lower extremity edema Musculoskeletal: no cyanosis of digits and no clubbing  PSYCH: alert & oriented x 3, fluent speech NEURO: no focal motor/sensory deficits  LABORATORY DATA:  I have reviewed the data as listed    Latest Ref Rng & Units 10/24/2023   10:20 AM 09/28/2023    2:20 PM 09/17/2023    5:27 PM  CBC  WBC 4.0 - 10.5 K/uL 8.9  10.8  10.8   Hemoglobin 13.0 - 17.0 g/dL 81.8  80.4  80.8   Hematocrit 39.0  - 52.0 % 50.8  56.5  54.7   Platelets 150 - 400 K/uL 129  157  164        Latest Ref Rng & Units 10/24/2023   10:20 AM 09/28/2023    2:20 PM 09/17/2023    5:27 PM  CMP  Glucose 70 - 99 mg/dL 91  89  74   BUN 6 - 20 mg/dL 12  16  17    Creatinine 0.61 - 1.24 mg/dL 8.91  8.77  8.89   Sodium 135 - 145 mmol/L 138  136  137   Potassium 3.5 - 5.1 mmol/L 4.3  4.4  4.1   Chloride 98 - 111 mmol/L 108  104  103   CO2 22 - 32 mmol/L 25  22  21    Calcium  8.9 - 10.3 mg/dL 9.0  8.8  9.4   Total Protein 6.5 - 8.1 g/dL 6.5     Total Bilirubin 0.0 - 1.2 mg/dL 0.9     Alkaline Phos 38 - 126 U/L 36     AST 15 - 41 U/L 19     ALT 0 - 44 U/L 19        ASSESSMENT & PLAN Joshua [name] 58 y.o. [name] with medical history significant for hypogonadism on testosterone  supplementation, gout, plantar fasciitis, and restless leg syndrome who presents for evaluation of polycythemia in the setting of testosterone  use.  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 Primary polycythemia is overproduction of red blood cells due to a driver mutation. The most common mutation is the JAK2 [id number] (95% of cases), but there are other mutations which can cause this disorder. Primary polycythemia is a myeloproliferative neoplasm which may require cytoreductive therapy to decrease risk of thrombosis. This can consist of medications or phlebotomy to drive down the red blood cell counts. Secondary polycythemia is polycythemia driven by low oxygen levels. This represents an appropriate response of the body attempting to  increase red cell volume. Causes of secondary polycythemia include smoking (most common), obstructive sleep apnea (OSA), or living at altitude. This can also be caused by testosterone  supplementation. Certain  thalassemias can present with marked erythrocytosis , but normal hemoglobin. Secondary polycythemia does not have the same level of thrombotic risk and therefore does not require cytoreductive therapy or phlebotomy.    #Polycythemia  --workup to include CBC, CMP, reticulocyte count, and erythropoietin  level  --Patient is a non-smoker --patient is using supplemental testosterone   --Previously tested negative for OSA --will order MPN workup to include JAK2 with reflex and BCR/ABL FISH  --Assuming the patient has no driver mutation the use of phlebotomy in these cases is controversial.  Typically as long as the hematocrit is less than 54% and the patient is asymptomatic there is no need for phlebotomy. --RTC on an as-needed basis moving forward   Orders Placed This Encounter  Procedures   CBC with Differential (Cancer Center Only)    Standing Status:   Future    Number of Occurrences:   1    Expiration Date:   10/23/2024   CMP (Cancer Center only)    Standing Status:   Future    Number of Occurrences:   1    Expiration Date:   10/23/2024   Ferritin    Standing Status:   Future    Number of Occurrences:   1    Expiration Date:   10/23/2024   Iron and Iron Binding Capacity ([facility] only)    Standing Status:   Future    Number of Occurrences:   1    Expiration Date:   10/23/2024   Retic Panel    Standing Status:   Future    Number of Occurrences:   1    Expiration Date:   10/23/2024   JAK2 V617 reflex CALR/MPL/E12-15    Standing Status:   Future    Number of Occurrences:   1    Expiration Date:   10/23/2024   BCR-ABL1 FISH    Standing Status:   Future    Number of Occurrences:   1    Expiration Date:   10/23/2024    All questions were answered. The patient knows to call the clinic with any problems, questions or concerns.  A total of more than 60 minutes were spent on this encounter with face-to-face time and non-face-to-face time, including preparing to see the patient, ordering tests  and/or medications, counseling the patient and coordination of care as outlined above.   Joshua IVAR Kidney, MD Department of Hematology/Oncology [facility] Phone: [phone] Pager: [phone] Email: [email or web address]  11/08/2023 9:50 PM

## 2023-10-28 LAB — BCR-ABL1 FISH
Cells Analyzed: 200
Cells Counted: 200

## 2023-10-30 NOTE — H&P (View-Only) (Signed)
 OFFICE NOTE:    Date:  10/31/2023  ID:  Joshua [name], DOB 04/28/1965, MRN [id number] PCP: Joshua Senior, MD  [facility] Providers Cardiologist:  None        Coronary artery disease Chest CT 09/28/23: Mod Coronary artery Ca2+ Aortic atherosclerosis Hyperlipidemia (elevated Trigs) Intol of Rosuva due to myalgias Polycythemia  Low testosterone         Discussed the use of AI scribe software for clinical note transcription with the patient, who gave verbal consent to proceed. History of Present Illness Joshua [name] is a 58 y.o. male who is referred by Joshua Senior, MD for the evaluation of chest pain. He went to the ED 09/17/23 with symptoms of chest pain. EKG demonstrated sinus rhythm, HR 75, normal axis, nonspecific ST-T wave changes, QTc 390.  Troponins were negative x 2.  D-dimer was also negative.  Chest x-ray showed no acute disease.  Outpatient cardiology evaluation was recommended.  He went to the emergency room again on 09/28/2023 with right-sided chest pain and right upper back pain.  There was concern for a kidney stone.  Chest CT demonstrated no evidence of pulmonary embolism.  There was mosaic attenuation pattern in the lungs consistent with small airways disease and mild patchy groundglass attenuation within the lungs which was nonspecific.  Of note, there was coronary artery calcification noted.  CT was negative for kidney stone.  He was managed with possible symptoms of bronchitis or pleurisy.  There was no evidence of pneumonia.  Of note, he recently saw hematology (Dr. Federico) for polycythemia.  Workup is currently pending.  Notes indicate he is being referred for sleep testing.  His chest discomfort began while at work. The sensation is described as 'a lot of pressure' and difficulty breathing, with a need to burp that provides some relief. The episode lasted approximately 30 minutes and was severe enough to prompt an emergency room visit. Similar  symptoms have occurred intermittently over the years, but this was the most severe episode. The discomfort does not consistently correlate with physical exertion and has not significantly worsened over the past several months. No radiation of chest discomfort to arms, jaw, or back. Occasional shortness of breath, especially with exertion, but not at rest or when lying flat. No swelling in legs.  His family history is significant for heart disease, with his mother having had heart failure at age 2 and his paternal grandparents having died of heart attacks.   He does not smoke and drinks alcohol occasionally. 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 He has been married 3 times and has 5 total children. One of his daughters died about 6 mos ago.     Review of Systems  Constitutional: Negative for fever.  Respiratory:  Negative for cough.   Gastrointestinal:  Positive for nausea. Negative for hematochezia and melena.  Genitourinary:  Negative for hematuria.  -See HPI    Studies Reviewed:      Labs reviewed via Care Everywhere 04/18/2023: Creatinine 1.24, GFR 67, K 4.5, ALT 23, magnesium 2, Hgb 18.8, PLT 133K 05/25/2023: Creatinine 1.24, K 4.6, ALT 28, TC 167, HDL 49, triglycerides 110, LDL 98          Physical Exam:  VS:  BP 124/64   Pulse 84   Ht 5' 10 (1.778 m)   Wt 266 lb (120.7 kg)  SpO2 96%   BMI 38.17 kg/m        Wt Readings from Last 3 Encounters:  10/31/23 266 lb (120.7 kg)  10/24/23 268 lb (121.6 kg)  09/28/23 267 lb (121.1 kg)    Constitutional:      Appearance: Healthy appearance. Not in distress.  Neck:     Vascular: No carotid bruit or JVR. JVD normal.  Pulmonary:     Breath sounds: Normal breath sounds. No wheezing. No rales.  Cardiovascular:     Normal rate. Regular rhythm.     Murmurs: There is no murmur.  Pulses:    Intact distal pulses.  Edema:     Peripheral edema absent.  Abdominal:     Palpations: Abdomen is soft.       Assessment and Plan:    Assessment & Plan Precordial chest pain Shortness of breath Intermittent chest discomfort described as pressure, occurring over years, not necessarily exertional. No significant worsening over the last several months, but one severe episode on May 31st lasting 30 minutes prompted ER visit. His Troponins were negative, which is reassuring. He had coronary artery Ca2+ noted on CT done to r/o PE. He has a family history of heart disease, hyperlipidemia. We discussed follow up testing to include stress testing vs CCTA vs cardiac catheterization. I favor CCTA to evaluate for ischemic heart disease. He also notes lung scarring from a hx of pneumonia and chronic dyspnea on exertion. I have also recommended an echocardiogram to r/o structural heart disease and assess pulmonary pressures.  - Order coronary CTA - Will decide on +/- ASA Rx based on CT results. - Will give Metoprolol  tartrate 100 mg x 1 prior to CCTA - Goal LDL is < 70  - Order echocardiogram to r/o structural heart disease - Follow up 2 mos Pure hypercholesterolemia LDL in 05/2023 was 98. With CAC, goal should be < 70. He had side effects to Rosuvastatin. He changed diet and got his triglycerides under control. Will see what his CCTA looks like first then decide on lipid management. We can start with Atorvastatin  mod intensity and then consider alternative Rx if he cannot tolerate this.  Polycythemia Workup pending with hematology.         Dispo:  Return in about 8 weeks (around 12/26/2023) for Follow up after testing, w/ [name], PA-C.  Signed, [name], PA-C

## 2023-10-30 NOTE — Progress Notes (Signed)
 OFFICE NOTE:    Date:  10/31/2023  ID:  Joshua [name], DOB 04/28/1965, MRN [id number] PCP: Joshua Senior, MD  [facility] Providers Cardiologist:  None        Coronary artery disease Chest CT 09/28/23: Mod Coronary artery Ca2+ Aortic atherosclerosis Hyperlipidemia (elevated Trigs) Intol of Rosuva due to myalgias Polycythemia  Low testosterone         Discussed the use of AI scribe software for clinical note transcription with the patient, who gave verbal consent to proceed. History of Present Illness Joshua [name] is a 58 y.o. male who is referred by Joshua Senior, MD for the evaluation of chest pain. He went to the ED 09/17/23 with symptoms of chest pain. EKG demonstrated sinus rhythm, HR 75, normal axis, nonspecific ST-T wave changes, QTc 390.  Troponins were negative x 2.  D-dimer was also negative.  Chest x-ray showed no acute disease.  Outpatient cardiology evaluation was recommended.  He went to the emergency room again on 09/28/2023 with right-sided chest pain and right upper back pain.  There was concern for a kidney stone.  Chest CT demonstrated no evidence of pulmonary embolism.  There was mosaic attenuation pattern in the lungs consistent with small airways disease and mild patchy groundglass attenuation within the lungs which was nonspecific.  Of note, there was coronary artery calcification noted.  CT was negative for kidney stone.  He was managed with possible symptoms of bronchitis or pleurisy.  There was no evidence of pneumonia.  Of note, he recently saw hematology (Dr. Federico) for polycythemia.  Workup is currently pending.  Notes indicate he is being referred for sleep testing.  His chest discomfort began while at work. The sensation is described as 'a lot of pressure' and difficulty breathing, with a need to burp that provides some relief. The episode lasted approximately 30 minutes and was severe enough to prompt an emergency room visit. Similar  symptoms have occurred intermittently over the years, but this was the most severe episode. The discomfort does not consistently correlate with physical exertion and has not significantly worsened over the past several months. No radiation of chest discomfort to arms, jaw, or back. Occasional shortness of breath, especially with exertion, but not at rest or when lying flat. No swelling in legs.  His family history is significant for heart disease, with his mother having had heart failure at age 2 and his paternal grandparents having died of heart attacks.   He does not smoke and drinks alcohol occasionally. 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 He has been married 3 times and has 5 total children. One of his daughters died about 6 mos ago.     Review of Systems  Constitutional: Negative for fever.  Respiratory:  Negative for cough.   Gastrointestinal:  Positive for nausea. Negative for hematochezia and melena.  Genitourinary:  Negative for hematuria.  -See HPI    Studies Reviewed:      Labs reviewed via Care Everywhere 04/18/2023: Creatinine 1.24, GFR 67, K 4.5, ALT 23, magnesium 2, Hgb 18.8, PLT 133K 05/25/2023: Creatinine 1.24, K 4.6, ALT 28, TC 167, HDL 49, triglycerides 110, LDL 98          Physical Exam:  VS:  BP 124/64   Pulse 84   Ht 5' 10 (1.778 m)   Wt 266 lb (120.7 kg)  SpO2 96%   BMI 38.17 kg/m        Wt Readings from Last 3 Encounters:  10/31/23 266 lb (120.7 kg)  10/24/23 268 lb (121.6 kg)  09/28/23 267 lb (121.1 kg)    Constitutional:      Appearance: Healthy appearance. Not in distress.  Neck:     Vascular: No carotid bruit or JVR. JVD normal.  Pulmonary:     Breath sounds: Normal breath sounds. No wheezing. No rales.  Cardiovascular:     Normal rate. Regular rhythm.     Murmurs: There is no murmur.  Pulses:    Intact distal pulses.  Edema:     Peripheral edema absent.  Abdominal:     Palpations: Abdomen is soft.       Assessment and Plan:    Assessment & Plan Precordial chest pain Shortness of breath Intermittent chest discomfort described as pressure, occurring over years, not necessarily exertional. No significant worsening over the last several months, but one severe episode on May 31st lasting 30 minutes prompted ER visit. His Troponins were negative, which is reassuring. He had coronary artery Ca2+ noted on CT done to r/o PE. He has a family history of heart disease, hyperlipidemia. We discussed follow up testing to include stress testing vs CCTA vs cardiac catheterization. I favor CCTA to evaluate for ischemic heart disease. He also notes lung scarring from a hx of pneumonia and chronic dyspnea on exertion. I have also recommended an echocardiogram to r/o structural heart disease and assess pulmonary pressures.  - Order coronary CTA - Will decide on +/- ASA Rx based on CT results. - Will give Metoprolol  tartrate 100 mg x 1 prior to CCTA - Goal LDL is < 70  - Order echocardiogram to r/o structural heart disease - Follow up 2 mos Pure hypercholesterolemia LDL in 05/2023 was 98. With CAC, goal should be < 70. He had side effects to Rosuvastatin. He changed diet and got his triglycerides under control. Will see what his CCTA looks like first then decide on lipid management. We can start with Atorvastatin  mod intensity and then consider alternative Rx if he cannot tolerate this.  Polycythemia Workup pending with hematology.         Dispo:  Return in about 8 weeks (around 12/26/2023) for Follow up after testing, w/ [name], PA-C.  Signed, [name], PA-C

## 2023-10-31 ENCOUNTER — Encounter: Payer: Self-pay | Admitting: Physician Assistant

## 2023-10-31 ENCOUNTER — Ambulatory Visit: Attending: Physician Assistant | Admitting: Physician Assistant

## 2023-10-31 VITALS — BP 124/64 | HR 84 | Ht 70.0 in | Wt 266.0 lb

## 2023-10-31 DIAGNOSIS — R072 Precordial pain: Secondary | ICD-10-CM

## 2023-10-31 DIAGNOSIS — R0602 Shortness of breath: Secondary | ICD-10-CM | POA: Diagnosis not present

## 2023-10-31 DIAGNOSIS — D751 Secondary polycythemia: Secondary | ICD-10-CM

## 2023-10-31 DIAGNOSIS — E7800 Pure hypercholesterolemia, unspecified: Secondary | ICD-10-CM | POA: Diagnosis not present

## 2023-10-31 LAB — JAK2 V617F RFX CALR/MPL/E12-15

## 2023-10-31 LAB — CALR +MPL + E12-E15  (REFLEX)

## 2023-10-31 MED ORDER — METOPROLOL TARTRATE 100 MG PO TABS: ORAL_TABLET | ORAL | 0 refills | Status: DC

## 2023-10-31 NOTE — Patient Instructions (Addendum)
 Medication Instructions:  Your physician recommends that you continue on your current medications as directed. Please refer to the Current Medication list given to you today.  *If you need a refill on your cardiac medications before your next appointment, please call your pharmacy*  Lab Work: None ordered  If you have labs (blood work) drawn today and your tests are completely normal, you will receive your results only by: MyChart Message (if you have MyChart) OR A paper copy in the mail If you have any lab test that is abnormal or we need to change your treatment, we will call you to review the results.  Testing/Procedures: Your physician has requested that you have an echocardiogram. Echocardiography is a painless test that uses sound waves to create images of your heart. It provides your doctor with information about the size and shape of your heart and how well your heart's chambers and valves are working. This procedure takes approximately one hour. There are no restrictions for this procedure. Please do NOT wear cologne, perfume, aftershave, or lotions (deodorant is allowed). Please arrive 15 minutes prior to your appointment time.  Please note: We ask at that you not bring children with you during ultrasound (echo/ vascular) testing. Due to room size and safety concerns, children are not allowed in the ultrasound rooms during exams. Our front office staff cannot provide observation of children in our lobby area while testing is being conducted. An adult accompanying a patient to their appointment will only be allowed in the ultrasound room at the discretion of the ultrasound technician under special circumstances. We apologize for any inconvenience.   Your physician has requested that you have cardiac CT. Cardiac computed tomography (CT) is a painless test that uses an x-ray machine to take clear, detailed pictures of your heart. For further information please visit [email or web address]  Please follow instruction sheet BELOW:    Your cardiac CT will be scheduled at one of the below locations:   [facility] [address] [place], [place] [address] [phone]  OR  [facility] [address] [place], [place] [address] [phone]  OR   [facility] [address] [place], [place] [address] [phone]  OR   [facility] [address] [place], [place] [address] [phone]  OR   [facility] [address]  [place], [place] [address]  If scheduled at [facility], please arrive at the [facility] Children[facility] Entrance (Entrance C2) of [facility] 30 minutes prior to test start time. You can use the FREE valet parking offered at entrance C (encouraged to control the heart rate for the test)  Proceed to the [facility] Radiology Department (first floor) to check-in and test prep.   All radiology patients and guests should use entrance C2 at [facility], accessed from [facility], even though the hospital's physical address listed is [address].    If scheduled at the Heart and Vascular Tower at [address], please enter the parking lot using the [address] entrance and use the FREE valet service at the patient drop-off area. Enter the buidling and check-in with registration on the main floor.  If scheduled at [facility] or [facility], please arrive 15 mins early for check-in and test prep.  There is spacious parking and easy access to the radiology department from the [facility] Heart and  Vascular entrance. Please enter here and check-in with the desk attendant.   If scheduled at [facility], please arrive 30 minutes early for check-in and test prep.  Please follow these instructions carefully (unless otherwise  directed):  An IV will be required for this test and Nitroglycerin will be given.  Hold all erectile dysfunction medications at least 3 days (72 hrs) prior to test. (Ie viagra, cialis, sildenafil, tadalafil, etc)   On the Night Before the Test: Be sure to Drink plenty of water. Do not consume any caffeinated/decaffeinated beverages or chocolate 12 hours prior to your test. Do not take any antihistamines 12 hours prior to your test.   On the Day of the Test: Drink plenty of water until 1 hour prior to the test. Do not eat any food 1 hour prior to test. You may take your regular medications prior to the test.  Take metoprolol  (Lopressor ) 100 MG two hours prior to test. THIS HAS BEEN SENT TO [facility]  After the Test: Drink plenty of water. After receiving IV contrast, you may experience a mild flushed feeling. This is normal. On occasion, you may experience a mild rash up to 24 hours after the test. This is not dangerous. If this occurs, you can take Benadryl  25 mg, Zyrtec, Claritin, or Allegra and increase your fluid intake. (Patients taking Tikosyn should avoid Benadryl , and may take Zyrtec, Claritin, or Allegra) If you experience trouble breathing, this can be serious. If it is severe call 911 IMMEDIATELY. If it is mild, please call our office.  We will call to schedule your test 2-4 weeks out understanding that some insurance companies will need an authorization prior to the service being performed.   For more information and frequently asked questions, please visit our website : [email or web address]  For non-scheduling related questions, please contact the cardiac imaging nurse navigator should you have any questions/concerns: Cardiac Imaging Nurse Navigators Direct Office Dial: [phone]   For scheduling needs, including cancellations and rescheduling, please call [place], [phone].   Follow-Up: At [facility], you and your health needs are our  priority.  As part of our continuing mission to provide you with exceptional heart care, our providers are all part of one team.  This team includes your primary Cardiologist (physician) and Advanced Practice Providers or APPs (Physician Assistants and Nurse Practitioners) who all work together to provide you with the care you need, when you need it.  Your next appointment:   2 month(s)  Provider:   [name], PA-C          We recommend signing up for the patient portal called MyChart.  Sign up information is provided on this After Visit Summary.  MyChart is used to connect with patients for Virtual Visits (Telemedicine).  Patients are able to view lab/test results, encounter notes, upcoming appointments, etc.  Non-urgent messages can be sent to your provider as well.   To learn more about what you can do with MyChart, go to [email or web address].   Other Instructions

## 2023-11-04 ENCOUNTER — Telehealth (HOSPITAL_COMMUNITY): Payer: Self-pay | Admitting: *Deleted

## 2023-11-04 NOTE — Telephone Encounter (Signed)
 Reaching out to patient to offer assistance regarding upcoming cardiac imaging study; pt verbalizes understanding of appt date/time, parking situation and where to check in, pre-test NPO status and medications ordered, and verified current allergies; name and call back number provided for further questions should they arise Johney Frame RN Navigator Cardiac Imaging [facility] [phone] office [phone] cell

## 2023-11-04 NOTE — Telephone Encounter (Signed)
 Attempted to call patient regarding upcoming cardiac CT appointment. Left message on voicemail with name and callback number Johney Frame RN Navigator Cardiac Imaging [facility] [phone] Office

## 2023-11-07 ENCOUNTER — Ambulatory Visit (HOSPITAL_COMMUNITY)
Admission: RE | Admit: 2023-11-07 | Discharge: 2023-11-07 | Disposition: A | Discharge status: Home / Self Care | Source: Ambulatory Visit | Attending: Physician Assistant | Admitting: Physician Assistant

## 2023-11-07 VITALS — BP 135/84 | HR 70

## 2023-11-07 DIAGNOSIS — R0602 Shortness of breath: Secondary | ICD-10-CM | POA: Diagnosis not present

## 2023-11-07 DIAGNOSIS — R931 Abnormal findings on diagnostic imaging of heart and coronary circulation: Secondary | ICD-10-CM

## 2023-11-07 DIAGNOSIS — R072 Precordial pain: Secondary | ICD-10-CM | POA: Insufficient documentation

## 2023-11-07 DIAGNOSIS — I2510 Atherosclerotic heart disease of native coronary artery without angina pectoris: Secondary | ICD-10-CM | POA: Diagnosis not present

## 2023-11-07 MED ORDER — NITROGLYCERIN 0.4 MG SL SUBL: 0.8000 mg | SUBLINGUAL_TABLET | Freq: Once | SUBLINGUAL | Status: DC

## 2023-11-07 MED ORDER — IOHEXOL 350 MG/ML SOLN
100.0000 mL | Freq: Once | INTRAVENOUS | Status: AC | PRN
  Administered 2023-11-07: 100 mL via INTRAVENOUS

## 2023-11-08 ENCOUNTER — Telehealth: Payer: Self-pay | Admitting: *Deleted

## 2023-11-08 NOTE — Telephone Encounter (Signed)
 Called pt & gave information per Dr Federico that there is no need for phlebotomy or f/u at this time but to talk to his testosterone  provider b/c this may be raising his HCT.  Pt expressed understanding & will talk with his PCP.

## 2023-11-09 ENCOUNTER — Other Ambulatory Visit: Payer: Self-pay | Admitting: Physician Assistant

## 2023-11-09 ENCOUNTER — Telehealth: Payer: Self-pay | Admitting: Physician Assistant

## 2023-11-09 ENCOUNTER — Encounter: Payer: Self-pay | Admitting: *Deleted

## 2023-11-09 ENCOUNTER — Ambulatory Visit: Payer: Self-pay | Admitting: Physician Assistant

## 2023-11-09 ENCOUNTER — Ambulatory Visit (HOSPITAL_COMMUNITY)
Admission: RE | Admit: 2023-11-09 | Discharge: 2023-11-09 | Disposition: A | Discharge status: Home / Self Care | Source: Ambulatory Visit | Attending: Cardiology | Admitting: Cardiology

## 2023-11-09 ENCOUNTER — Encounter (HOSPITAL_BASED_OUTPATIENT_CLINIC_OR_DEPARTMENT_OTHER): Payer: Self-pay | Admitting: *Deleted

## 2023-11-09 DIAGNOSIS — R931 Abnormal findings on diagnostic imaging of heart and coronary circulation: Secondary | ICD-10-CM | POA: Insufficient documentation

## 2023-11-09 DIAGNOSIS — I2510 Atherosclerotic heart disease of native coronary artery without angina pectoris: Secondary | ICD-10-CM | POA: Diagnosis not present

## 2023-11-09 DIAGNOSIS — Z79899 Other long term (current) drug therapy: Secondary | ICD-10-CM

## 2023-11-09 MED ORDER — ASPIRIN 81 MG PO TBEC: 81.0000 mg | DELAYED_RELEASE_TABLET | Freq: Every day | ORAL | Status: AC

## 2023-11-09 MED ORDER — ATORVASTATIN CALCIUM 40 MG PO TABS: 40.0000 mg | ORAL_TABLET | Freq: Every day | ORAL | 1 refills | Status: DC

## 2023-11-09 NOTE — Telephone Encounter (Signed)
 I am covering [facility] inbox this week. He evaluated patient in [facility] with chest pain/pressure and chronic DOE. Echo still pending but cardiac CT returned abnormal with CAC 1942 (99%ile) with extensive total plaque volume, with extensive  mixed plaque throughout all vessels with moderate to severe stenosis. FFR analysis shows proximal/mid LAD flow limitation, distal RCA/PDA/posterolateral branch flow limitation. Reading physician recommended cardiac catheterization. Patient not yet established with one of our cardiologists. Discussed with Dr. Mona (DOD) who agrees with need for cardiac catheterization. I called the patient and discussed with him and his wife on conference call, total time duration 20 minutes. He denies any recent chest pain but does report he has been experiencing generalized fatigue. I discussed the cardiac portion of CT findings (overread still pending) and discussed cardiac catheterization.   Informed Consent   Shared Decision Making/Informed Consent The risks [stroke (1 in 1000), death (1 in 1000), kidney failure [usually temporary] (1 in 500), bleeding (1 in 200), allergic reaction [possibly serious] (1 in 200)], benefits (diagnostic support and management of coronary artery disease) and alternatives of a cardiac catheterization were discussed in detail with Mr. Bhat and he is willing to proceed.    I also advised starting ASA 81mg  daily (which he has already done, added to med list) and atorvastatin  40mg  daily as per Scott's recent note. Will need to re-eval plan for statin/lipid follow-up at follow-up visit. He reports he is supposed to be going on vacation August 10th - will need to see what cath results show before advising on travel. 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 I have advised he remain out of  work until cardiac evaluation is completed (OK to give work note).  We also discussed ED precautions.  Delon - please schedule left heart cath with possible PCI next available date, and arrange 2-3 week post-hospital visit after that with either me or Scott.  Thank you!  Oluwadarasimi Redmon N Farida Mcreynolds, PA-C

## 2023-11-09 NOTE — Telephone Encounter (Signed)
 Called patient back and answered his questions about secondary findings on CT.  Patient also inquiring about having work note emailed to him - I don't think we can do this. Delon, can you help place a work note through [employer] for them to perhaps print or pick up? Thank you!

## 2023-11-09 NOTE — Telephone Encounter (Signed)
 Spoke with pt and wife.  They both have been made aware of cardiac cath being scheduled and preparation.  Helped pt's wife get pt set up on mychart and instructions will be sent via mychart.             Cardiac/Peripheral Catheterization   You are scheduled for a Cardiac Catheterization on Tuesday, July 29 with Dr. [name].  1. Please arrive at the [facility] (Main Entrance A) at [facility]: [address] [place], [place] [address] at 7:00 AM (This time is 2 hour(s) before your procedure to ensure your preparation).   Free valet parking service is available. You will check in at ADMITTING. The support person will be asked to wait in the waiting room.  It is OK to have someone drop you off and come back when you are ready to be discharged.        Special note: Every effort is made to have your procedure done on time. Please understand that emergencies sometimes delay scheduled procedures.  2. Diet: Do not eat solid foods after midnight.  You may have clear liquids until 5 AM the day of the procedure.  3. Labs: You will need to have blood drawn on BETWEEN NOW AND FRIDAY LUNCH TIME You do not need to be fasting. [facility] LOCATIONS:  [facility] locations:   [facility]  - [address] ([facility]) - [phone] [address]   [place] [facility] [address].    [place]  - [address]    [place] - [address] - [address] [employer]  - [address] ([facility])   4. Medication instructions in preparation for your procedure:   Contrast Allergy: No   On the morning of your procedure, take Aspirin  81 mg and any morning medicines NOT listed above.  You may use sips of water.  5. Plan to go home the same day, you will only stay overnight if medically necessary. 6. You MUST have a responsible  adult to drive you home. 7. An adult MUST be with you the first 24 hours after you arrive home. 8. Bring a current list of your medications, and the last time and date medication taken. 9. Bring ID and current insurance cards. 10.Please wear clothes that are easy to get on and off and wear slip-on shoes.  Thank you for allowing us  to care for you!   -- [facility]

## 2023-11-09 NOTE — Telephone Encounter (Signed)
Work note has been sent via [employer]

## 2023-11-09 NOTE — Telephone Encounter (Signed)
 Patient called wanting to speak with Dayna Dunn again, he wants to get something clarified about his CT results. He states he spoke with her earlier today, and him and his wife heard something different.

## 2023-11-10 ENCOUNTER — Telehealth: Payer: Self-pay | Admitting: Physician Assistant

## 2023-11-10 NOTE — Telephone Encounter (Signed)
 I spoke with patient and advised him not to drink beer until after he has cath next week. I let him know further recommendations could be made after procedure.

## 2023-11-10 NOTE — Telephone Encounter (Signed)
 Pt would like to know if he can drink a few cans of beer like on the weekends with his heart condition. Requesting cb to advise

## 2023-11-11 ENCOUNTER — Ambulatory Visit: Payer: Self-pay

## 2023-11-11 ENCOUNTER — Telehealth: Payer: Self-pay | Admitting: Physician Assistant

## 2023-11-11 LAB — CBC
Hematocrit: 54.3 % — ABNORMAL HIGH (ref 37.5–51.0)
Hemoglobin: 18.4 g/dL — ABNORMAL HIGH (ref 13.0–17.7)
MCH: 33.4 pg — ABNORMAL HIGH (ref 26.6–33.0)
MCHC: 33.9 g/dL (ref 31.5–35.7)
MCV: 99 fL — ABNORMAL HIGH (ref 79–97)
Platelets: 145 x10E3/uL — ABNORMAL LOW (ref 150–450)
RBC: 5.51 x10E6/uL (ref 4.14–5.80)
RDW: 13 % (ref 11.6–15.4)
WBC: 8.9 x10E3/uL (ref 3.4–10.8)

## 2023-11-11 LAB — BASIC METABOLIC PANEL WITH GFR
BUN/Creatinine Ratio: 8 — AB (ref 9–20)
BUN: 9 mg/dL (ref 6–24)
CO2: 20 mmol/L (ref 20–29)
Calcium: 8.8 mg/dL (ref 8.7–10.2)
Chloride: 104 mmol/L (ref 96–106)
Creatinine, Ser: 1.19 mg/dL (ref 0.76–1.27)
Glucose: 75 mg/dL (ref 70–99)
Potassium: 4.1 mmol/L (ref 3.5–5.2)
Sodium: 139 mmol/L (ref 134–144)
eGFR: 71 mL/min/1.73 (ref >59)

## 2023-11-11 NOTE — Telephone Encounter (Signed)
 Pt is requesting a callback regarding him wanting to speak with a nurse about his results. He stated although he seen it in MyChart and a message was received, he still doesn't understand and has questions. Please advise.

## 2023-11-11 NOTE — Telephone Encounter (Signed)
 Raphael LOISE Bring, PA-C 11/11/2023 12:59 PM EDT Back to Top    Covering Scott's inbox, sent to [employer]. Pre cath labs stable. 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 Federico - FYI since your last visit with him, patient is planned for heart cath due to chest pain, fatigue, and multivessel CAD on coronary CTA.)      Patient Communication   Add MyChart Message   Seen Back to Top   Hi Mr. Papadakis, I am covering Scott's inbox again for today. Your pre-cath labs looked stable. Hemoglobin level remains elevated and platelet count is just a few points below normal, overall look similar to earlier this month. I sent an update to your hematologist Dr. Federico about the plan for heart cath. Otherwise, let's continue the plan as discussed. Take care and good luck to you on your procedure. Dayna, PA    Left message for patient to call back. Above is his lab results.

## 2023-11-14 ENCOUNTER — Telehealth: Payer: Self-pay | Admitting: *Deleted

## 2023-11-14 NOTE — Telephone Encounter (Signed)
 Cardiac Catheterization scheduled at [facility] for: Tuesday November 15, 2023 9 AM Arrival time [facility] Main Entrance A at: 7 AM  Nothing to eat after midnight prior to procedure, clear liquids until 5 AM day of procedure.  Medication instructions: -Usual morning medications can be taken with sips of water  including aspirin  81 mg.  Plan to go home the same day, you will only stay overnight if medically necessary.  You must have responsible adult to drive you home.  Someone must be with you the first 24 hours after you arrive home.  Reviewed procedure instructions with patient.

## 2023-11-15 ENCOUNTER — Other Ambulatory Visit: Payer: Self-pay

## 2023-11-15 ENCOUNTER — Encounter (HOSPITAL_COMMUNITY)
Admission: RE | Disposition: A | Discharge status: Home / Self Care | Payer: Self-pay | Source: Home / Self Care | Attending: Cardiology

## 2023-11-15 ENCOUNTER — Other Ambulatory Visit (HOSPITAL_COMMUNITY): Payer: Self-pay

## 2023-11-15 ENCOUNTER — Ambulatory Visit (HOSPITAL_COMMUNITY)
Admission: RE | Admit: 2023-11-15 | Discharge: 2023-11-15 | Disposition: A | Discharge status: Home / Self Care | Attending: Cardiology | Admitting: Cardiology

## 2023-11-15 DIAGNOSIS — I25119 Atherosclerotic heart disease of native coronary artery with unspecified angina pectoris: Secondary | ICD-10-CM

## 2023-11-15 DIAGNOSIS — Z006 Encounter for examination for normal comparison and control in clinical research program: Secondary | ICD-10-CM

## 2023-11-15 DIAGNOSIS — E7800 Pure hypercholesterolemia, unspecified: Secondary | ICD-10-CM | POA: Insufficient documentation

## 2023-11-15 DIAGNOSIS — Z955 Presence of coronary angioplasty implant and graft: Secondary | ICD-10-CM

## 2023-11-15 DIAGNOSIS — Z8249 Family history of ischemic heart disease and other diseases of the circulatory system: Secondary | ICD-10-CM | POA: Diagnosis not present

## 2023-11-15 DIAGNOSIS — I2510 Atherosclerotic heart disease of native coronary artery without angina pectoris: Secondary | ICD-10-CM | POA: Insufficient documentation

## 2023-11-15 DIAGNOSIS — Z79899 Other long term (current) drug therapy: Secondary | ICD-10-CM | POA: Diagnosis not present

## 2023-11-15 DIAGNOSIS — D751 Secondary polycythemia: Secondary | ICD-10-CM | POA: Diagnosis not present

## 2023-11-15 DIAGNOSIS — I700 Atherosclerosis of aorta: Secondary | ICD-10-CM | POA: Insufficient documentation

## 2023-11-15 DIAGNOSIS — R931 Abnormal findings on diagnostic imaging of heart and coronary circulation: Secondary | ICD-10-CM | POA: Diagnosis present

## 2023-11-15 DIAGNOSIS — Z7982 Long term (current) use of aspirin: Secondary | ICD-10-CM | POA: Insufficient documentation

## 2023-11-15 DIAGNOSIS — Z7902 Long term (current) use of antithrombotics/antiplatelets: Secondary | ICD-10-CM | POA: Diagnosis not present

## 2023-11-15 DIAGNOSIS — E291 Testicular hypofunction: Secondary | ICD-10-CM | POA: Insufficient documentation

## 2023-11-15 HISTORY — PX: LEFT HEART CATH AND CORONARY ANGIOGRAPHY: CATH118249

## 2023-11-15 HISTORY — PX: CORONARY STENT INTERVENTION: CATH118234

## 2023-11-15 HISTORY — PX: CORONARY PRESSURE/FFR STUDY: CATH118243

## 2023-11-15 LAB — POCT ACTIVATED CLOTTING TIME
Activated Clotting Time: 216 s
Activated Clotting Time: 308 s
Activated Clotting Time: 228 s

## 2023-11-15 LAB — HELIX PHARMACOGENOMICS (PGX) CLOPIDOGREL TEST

## 2023-11-15 SURGERY — LEFT HEART CATH AND CORONARY ANGIOGRAPHY
Anesthesia: LOCAL

## 2023-11-15 MED ORDER — LIDOCAINE HCL (PF) 1 % IJ SOLN
INTRAMUSCULAR | Status: DC | PRN
  Administered 2023-11-15: 5 mL

## 2023-11-15 MED ORDER — HEPARIN SODIUM (PORCINE) 1000 UNIT/ML IJ SOLN
INTRAMUSCULAR | Status: AC
  Filled 2023-11-15: qty 10

## 2023-11-15 MED ORDER — CLOPIDOGREL BISULFATE 300 MG PO TABS
ORAL_TABLET | ORAL | Status: AC
  Filled 2023-11-15: qty 2

## 2023-11-15 MED ORDER — CLOPIDOGREL BISULFATE 300 MG PO TABS
ORAL_TABLET | ORAL | Status: DC | PRN
  Administered 2023-11-15: 600 mg via ORAL

## 2023-11-15 MED ORDER — HEPARIN SODIUM (PORCINE) 1000 UNIT/ML IJ SOLN
INTRAMUSCULAR | Status: AC
Start: 2023-11-15 — End: 2023-11-15
  Filled 2023-11-15: qty 10

## 2023-11-15 MED ORDER — ASPIRIN 81 MG PO CHEW: 81.0000 mg | CHEWABLE_TABLET | ORAL | Status: DC

## 2023-11-15 MED ORDER — SODIUM CHLORIDE 0.9 % IV SOLN: INTRAVENOUS | Status: DC

## 2023-11-15 MED ORDER — SODIUM CHLORIDE 0.9% FLUSH: 3.0000 mL | Freq: Two times a day (BID) | INTRAVENOUS | Status: DC

## 2023-11-15 MED ORDER — HEPARIN SODIUM (PORCINE) 1000 UNIT/ML IJ SOLN
INTRAMUSCULAR | Status: DC | PRN
  Administered 2023-11-15: 4000 [IU] via INTRAVENOUS
  Administered 2023-11-15 (×2): 6000 [IU] via INTRAVENOUS
  Administered 2023-11-15: 4000 [IU] via INTRAVENOUS

## 2023-11-15 MED ORDER — FENTANYL CITRATE (PF) 100 MCG/2ML IJ SOLN
INTRAMUSCULAR | Status: DC | PRN
  Administered 2023-11-15 (×4): 25 ug via INTRAVENOUS

## 2023-11-15 MED ORDER — LABETALOL HCL 5 MG/ML IV SOLN
10.0000 mg | INTRAVENOUS | Status: AC | PRN
Start: 2023-11-15 — End: 2023-11-15

## 2023-11-15 MED ORDER — HEPARIN (PORCINE) IN NACL 1000-0.9 UT/500ML-% IV SOLN
INTRAVENOUS | Status: DC | PRN
  Administered 2023-11-15 (×2): 500 mL

## 2023-11-15 MED ORDER — VERAPAMIL HCL 2.5 MG/ML IV SOLN
INTRAVENOUS | Status: DC | PRN
  Administered 2023-11-15 (×2): 10 mL via INTRA_ARTERIAL

## 2023-11-15 MED ORDER — CLOPIDOGREL BISULFATE 75 MG PO TABS
75.0000 mg | ORAL_TABLET | Freq: Every day | ORAL | 3 refills | Status: AC
  Filled 2023-11-15: qty 90, 90d supply, fill #0

## 2023-11-15 MED ORDER — SODIUM CHLORIDE 0.9 % WEIGHT BASED INFUSION: 1.0000 mL/kg/h | INTRAVENOUS | Status: DC

## 2023-11-15 MED ORDER — ONDANSETRON HCL 4 MG/2ML IJ SOLN: 4.0000 mg | Freq: Four times a day (QID) | INTRAMUSCULAR | Status: DC | PRN

## 2023-11-15 MED ORDER — HYDRALAZINE HCL 20 MG/ML IJ SOLN: 10.0000 mg | INTRAMUSCULAR | Status: AC | PRN

## 2023-11-15 MED ORDER — FENTANYL CITRATE (PF) 100 MCG/2ML IJ SOLN
INTRAMUSCULAR | Status: AC
Start: 2023-11-15 — End: 2023-11-15
  Filled 2023-11-15: qty 2

## 2023-11-15 MED ORDER — VERAPAMIL HCL 2.5 MG/ML IV SOLN
INTRAVENOUS | Status: AC
  Filled 2023-11-15: qty 2

## 2023-11-15 MED ORDER — NITROGLYCERIN 0.4 MG SL SUBL
0.4000 mg | SUBLINGUAL_TABLET | SUBLINGUAL | 3 refills | Status: AC | PRN
  Filled 2023-11-15: qty 25, 5d supply, fill #0

## 2023-11-15 MED ORDER — IOHEXOL 350 MG/ML SOLN
INTRAVENOUS | Status: DC | PRN
  Administered 2023-11-15: 240 mL

## 2023-11-15 MED ORDER — SODIUM CHLORIDE 0.9% FLUSH: 3.0000 mL | INTRAVENOUS | Status: DC | PRN

## 2023-11-15 MED ORDER — MIDAZOLAM HCL 2 MG/2ML IJ SOLN
INTRAMUSCULAR | Status: AC
  Filled 2023-11-15: qty 2

## 2023-11-15 MED ORDER — SODIUM CHLORIDE 0.9 % WEIGHT BASED INFUSION: 3.0000 mL/kg/h | INTRAVENOUS | Status: AC

## 2023-11-15 MED ORDER — LIDOCAINE HCL (PF) 1 % IJ SOLN
INTRAMUSCULAR | Status: AC
  Filled 2023-11-15: qty 30

## 2023-11-15 MED ORDER — CLOPIDOGREL BISULFATE 75 MG PO TABS: 75.0000 mg | ORAL_TABLET | Freq: Every day | ORAL | Status: DC

## 2023-11-15 MED ORDER — MIDAZOLAM HCL 2 MG/2ML IJ SOLN
INTRAMUSCULAR | Status: AC
Start: 2023-11-15 — End: 2023-11-15
  Filled 2023-11-15: qty 2

## 2023-11-15 MED ORDER — ACETAMINOPHEN 325 MG PO TABS
650.0000 mg | ORAL_TABLET | ORAL | Status: DC | PRN
Start: 2023-11-15 — End: 2023-11-15

## 2023-11-15 MED ORDER — MIDAZOLAM HCL 2 MG/2ML IJ SOLN
INTRAMUSCULAR | Status: DC | PRN
Start: 2023-11-15 — End: 2023-11-15
  Administered 2023-11-15 (×2): 1 mg via INTRAVENOUS
  Administered 2023-11-15: 2 mg via INTRAVENOUS

## 2023-11-15 MED ORDER — SODIUM CHLORIDE 0.9 % IV SOLN: 250.0000 mL | INTRAVENOUS | Status: DC | PRN

## 2023-11-15 SURGICAL SUPPLY — 19 items
BALLOON SAPPHIRE 2.25X15 (BALLOONS) IMPLANT
BALLOON ~~LOC~~ EMERGE MR 2.75X12 (BALLOONS) IMPLANT
CATH INFINITI 5 FR JL3.5 (CATHETERS) IMPLANT
CATH INFINITI AMBI 5FR TG (CATHETERS) IMPLANT
CATH LAUNCHER 6FR AL1 (CATHETERS) IMPLANT
CATH VISTA GUIDE 6FR XB3.5 EPK (CATHETERS) IMPLANT
DEVICE RAD COMP TR BAND LRG (VASCULAR PRODUCTS) IMPLANT
GLIDESHEATH SLEND SS 6F .021 (SHEATH) IMPLANT
GUIDEWIRE INQWIRE 1.5J.035X260 (WIRE) IMPLANT
GUIDEWIRE PRESSURE X 175 (WIRE) IMPLANT
KIT ENCORE 26 ADVANTAGE (KITS) IMPLANT
KIT ESSENTIALS PG (KITS) IMPLANT
PACK CARDIAC CATHETERIZATION (CUSTOM PROCEDURE TRAY) ×1 IMPLANT
SET ATX-X65L (MISCELLANEOUS) IMPLANT
SHEATH PROBE COVER 6X72 (BAG) IMPLANT
STENT SYNERGY XD 2.50X16 (Permanent Stent) IMPLANT
STENT SYNERGY XD 2.50X48 (Permanent Stent) IMPLANT
WIRE ASAHI PROWATER 180CM (WIRE) IMPLANT
WIRE HI TORQ BMW 190CM (WIRE) IMPLANT

## 2023-11-15 NOTE — Progress Notes (Signed)
Report from Jenny RN

## 2023-11-15 NOTE — Progress Notes (Signed)
 Discussed with pt stents, restrictions, Plavix  importance, diet, tobacco cessation, exercise, NTG and CRPII. Pt receptive with many questions. Will refer to [place] CRPII. He is planning to quit chewing tobacco gradually. Resources given.  [id number] Aliene Aris BS, ACSM-CEP 11/15/2023 11:59 AM

## 2023-11-15 NOTE — Interval H&P Note (Signed)
 History and Physical Interval Note:  11/15/2023 8:48 AM    Dunn, Raphael SAILOR, PA-C  Physician Assistant Certified Cardiology   Telephone Encounter Signed   Encounter Date: 11/09/2023  Related encounter: Telephone from 11/09/2023 in [facility] at [facility] [employer]. [facility]   Signed     Expand All Collapse All  I am covering [facility] inbox this week. He evaluated patient in [facility] with chest pain/pressure and chronic DOE. Echo still pending but cardiac CT returned abnormal with CAC 1942 (99%ile) with extensive total plaque volume, with extensive  mixed plaque throughout all vessels with moderate to severe stenosis. FFR analysis shows proximal/mid LAD flow limitation, distal RCA/PDA/posterolateral branch flow limitation. Reading physician recommended cardiac catheterization. Patient not yet established with one of our cardiologists. Discussed with Dr. Mona (DOD) who agrees with need for cardiac catheterization. I called the patient and discussed with him and his wife on conference call, total time duration 20 minutes. He denies any recent chest pain but does report he has been experiencing generalized fatigue. I discussed the cardiac portion of CT findings (overread still pending) and discussed cardiac catheterization.    Informed Consent Shared Decision Making/Informed Consent The risks [stroke (1 in 1000), death (1 in 1000), kidney failure [usually temporary] (1 in 500), bleeding (1 in 200), allergic reaction [possibly serious] (1 in 200)], benefits (diagnostic support and management of coronary artery disease) and alternatives of a cardiac catheterization were discussed in detail with Mr. Yannuzzi and he is willing to proceed.     I also advised starting ASA 81mg  daily (which he has already done, added to med list) and atorvastatin  40mg  daily as per Scott's recent note. Will need to re-eval plan for statin/lipid follow-up at follow-up visit. He reports he is  supposed to be going on vacation August 10th - will need to see what cath results show before advising on travel. 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 I have advised he remain out of work until cardiac evaluation is completed (OK to give work note).  We also discussed ED precautions.   Delon - please schedule left heart cath with possible PCI next available date, and arrange 2-3 week post-hospital visit after that with either me or Scott.  Thank you!   Dayna N Dunn, PA-C             [name]

## 2023-11-15 NOTE — Interval H&P Note (Signed)
 History and Physical Interval Note:  11/15/2023 8:45 AM  Joshua [name]  has presented today for surgery, with the diagnosis of Abnormal Cardiac CT.  The various methods of treatment have been discussed with the patient and family. After consideration of risks, benefits and other options for treatment, the patient has consented to  Procedure(s): LEFT HEART CATH AND CORONARY ANGIOGRAPHY (N/A)  PERCUTANEOUS CORONARY INTERVENTION   as a surgical intervention.  The patient's history has been reviewed, patient examined, no change in status, stable for surgery.  I have reviewed the patient's chart and labs.  Questions were answered to the patient's satisfaction.    Cath Lab Visit (complete for each Cath Lab visit)  Clinical Evaluation Leading to the Procedure:   ACS: No.  Non-ACS:    Anginal Classification: CCS II  Anti-ischemic medical therapy: Minimal Therapy (1 class of medications)  Non-Invasive Test Results: High-risk stress test findings: cardiac mortality >3%/year  Prior CABG: No previous CABG    [name]

## 2023-11-15 NOTE — Research (Addendum)
 Prevail Informed Consent  Error

## 2023-11-15 NOTE — Discharge Summary (Signed)
 Discharge Summary for Same Day PCI   Patient ID: Joshua [name] MRN: [id number]; DOB: December 10, 1965  Admit date: 11/15/2023 Discharge date: 11/15/2023  Primary Care Provider: Nanci Senior, MD  Primary Cardiologist: Joshua MARLA Red, MD  Primary Electrophysiologist:  None   Discharge Diagnoses    Active Problems:   CAD (coronary artery disease)   Diagnostic Studies/Procedures    Cardiac Catheterization 11/15/2023:    Lesion #1 mid LAD lesion is 70% stenosed. 1st Diag lesion is 65% stenosed.  (Sidebranch is very small caliber)   A drug-eluting stent was successfully placed covering the 70% LAD lesion and crossing sidebranch, using a STENT SYNERGY XD 2.50X16.  Stent postdilated to 2.8 mm. Post intervention, there is a 0% residual stenosis with stable 60% stenosis in the sidebranch.  TIMI-3 flow maintained..   Dist RCA lesion is 40% stenosed.   Lesion Segment #2 RPDA-1 lesion is 60% stenosed with 70% stenosed small side branch in Inf Sept. RPDA-2 lesion is 80% stenosed.   A drug-eluting stent was successfully placed covering the 60 and 80% lesions in the PDA, using a STENT SYNERGY XD 2.50X48-deployed to 2.65 mm..   Post intervention, there is a 0% residual stenosis in the PDA-both lesions..  Post intervention, the side branch was stable at 70% residual stenosis.  TIMI-3 flow maintained.   -------------------------------------------------------   Post intervention, the side branch was reduced to 70% residual stenosis.   The left ventricular systolic function is normal. The left ventricular ejection fraction is 55-65% by visual estimate.   LV end diastolic pressure is normal.  There is no aortic valve stenosis.   Diagnostic    Dominance: Right                                       Intervention                                      Two-Vessel CAD: Mid LAD focal eccentric 70% stenosis at a bend (RFR 0.89 and significant positive by [facility]), very small caliber (1.5 mm) diagonal branch with  moderate proximal disease-too small for PCI. Successful DES PCI of mid LAD 70% reduced to 0%: Synergy XD 2.5 x 16 mm stent; postdilated to 2.8 mm. TIMI-3 flow maintained Mild diffuse disease in the large dominant RCA with long wraparound PDA that has a mid 80% stenosis preceded by diffuse 60% stenosis at takeoff of a major septal perforator branch that also has moderate to severe disease. (Both FFR positive by CT) Successful DES PCI of a long segment of the PDA covering the upstream 60% stenosis in the 80% stenosis reducing to 0%: Synergy XD 2.5 mm x 48 mm deployed to 2.65 mm. TIMI-3 flow maintained; no notable ostial stenosis of the septal perforator. Large-caliber LCx that gives rise to a small caliber RI branch and gives off AV groove branch that terminates as a small LPL, the main LCx terminates as a lateral OM 3 after giving rise to a small OM 2-minimal disease. Normal LV size and function with normal EDP.      RECOMMENDATIONS   In the absence of any other complications or medical issues, we expect the patient to be ready for discharge from an interventional cardiology perspective on 11/15/2023.   Due to significant contrast burden, will hydrate for 6 hours  post PCI with same-day discharge. Blood pressure is borderline, will hold off on initiation of ARB or CCB.  Would avoid beta-blocker avoid fatigue. Continue high-dose statin   Recommend uninterrupted dual antiplatelet therapy with Aspirin  81mg  daily and Clopidogrel  75mg  daily for a minimum of 6 months (stable ischemic heart disease-Class I recommendation).   After 7-month DAPT, would stop aspirin  and continue Plavix  (interruptible) for long-term maintenance coverage given the extensive stent in the PDA and LAD.       Joshua MICAEL Clay, MD, MS Joshua [name], M.D., M.S. Interventional Cardiologist  [facility]  Pager # [phone] _____________   History of Present Illness     Joshua [name] is a 58 y.o. male with PMH of HLD,  polycythemia, aortic atherosclerosis, CAD noted on CT chest who was seen in the office on 7/14 with Joshua [name]. He went to the ED 09/17/23 with symptoms of chest pain. EKG demonstrated sinus rhythm, HR 75, normal axis, nonspecific ST-T wave changes, QTc 390.  Troponins were negative x 2.  D-dimer was also negative.  Chest x-ray showed no acute disease.  Outpatient cardiology evaluation was recommended.   He went to the emergency room again on 09/28/2023 with right-sided chest pain and right upper back pain.  There was concern for a kidney stone.  Chest CT demonstrated no evidence of pulmonary embolism.  There was mosaic attenuation pattern in the lungs consistent with small airways disease and mild patchy groundglass attenuation within the lungs which was nonspecific.  Of note, there was coronary artery calcification noted.  CT was negative for kidney stone.  He was managed with possible symptoms of bronchitis or pleurisy.  There was no evidence of pneumonia.   Of note, he recently saw hematology (Dr. Federico) for polycythemia.  Workup pending.  Notes indicate he was being referred for sleep testing.   His chest discomfort began while at work. The sensation was described as 'a lot of pressure' and difficulty breathing, with a need to burp that provides some relief. The episode lasted approximately 30 minutes and was severe enough to prompt an emergency room visit. Similar symptoms have occurred intermittently over the years, but this was the most severe episode. The discomfort does not consistently correlate with physical exertion and has not significantly worsened over the past several months. No radiation of chest discomfort to arms, jaw, or back. Occasional shortness of breath, especially with exertion, but not at rest or when lying flat. No swelling in legs.   His family history was significant for heart disease, with his mother having had heart failure at age 72 and his paternal grandparents having died  of heart attacks.    He does not smoke and drinks alcohol occasionally. 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 He has been married 3 times and has 5 total children. One of his daughters died about 6 mos ago. Given symptoms he was set up for outpatient CCTa which was abnormal with CAC 1942 (99%ile) with extensive total plaque volume, with extensive  mixed plaque throughout all vessels with moderate to severe stenosis. FFR analysis shows proximal/mid LAD flow limitation, distal RCA/PDA/posterolateral branch flow limitation. It was recommended he proceed with cardiac cath.   Hospital Course     The patient underwent cardiac cath as noted above with mLAD 70% stenosis with RFR 0.89 and positive by FFRCT treated with PCI/DESx1, small diag  to be treated medically, 80% and 60% stenosis of PDA treated with 48mm stent. Plan for DAPT with ASA/plavix  for at least 6 months. The patient was seen by cardiac rehab while in short stay. There were no observed complications post cath. Radial cath site was re-evaluated prior to discharge and found to be stable without any complications. Instructions/precautions regarding cath site care were given prior to discharge.  Joshua [name] was seen by Dr. Anner and determined stable for discharge home. Follow up with our office has been arranged. Medications are listed below. Pertinent changes include addition of plavix .  _____________  Cath/PCI Registry Performance & Quality Measures: Aspirin  prescribed? - Yes ADP Receptor Inhibitor (Plavix /Clopidogrel , Brilinta/Ticagrelor or Effient/Prasugrel) prescribed (includes medically managed patients)? - Yes High Intensity Statin (Lipitor 40-80mg  or Crestor 20-40mg ) prescribed? - Yes For EF <40%, was ACEI/ARB prescribed? - Not Applicable (EF >/= 40%) For EF <40%, Aldosterone Antagonist (Spironolactone or Eplerenone)  prescribed? - Not Applicable (EF >/= 40%) Cardiac Rehab Phase II ordered (Included Medically managed Patients)? - Yes  _____________   Discharge Vitals Blood pressure 127/71, pulse 75, temperature 98.7 F (37.1 C), temperature source Oral, resp. rate 19, height 5' 10 (1.778 m), weight 122.5 kg, SpO2 94%.  Filed Weights   11/15/23 0723  Weight: 122.5 kg    Last Labs & Radiologic Studies    CBC No results for input(s): WBC, NEUTROABS, HGB, HCT, MCV, PLT in the last 72 hours. Basic Metabolic Panel No results for input(s): NA, K, CL, CO2, GLUCOSE, BUN, CREATININE, CALCIUM , MG, PHOS in the last 72 hours. Liver Function Tests No results for input(s): AST, ALT, ALKPHOS, BILITOT, PROT, ALBUMIN in the last 72 hours. No results for input(s): LIPASE, AMYLASE in the last 72 hours. High Sensitivity Troponin:   No results for input(s): TROPONINIHS in the last 720 hours.  BNP Invalid input(s): POCBNP D-Dimer No results for input(s): DDIMER in the last 72 hours. Hemoglobin A1C No results for input(s): HGBA1C in the last 72 hours. Fasting Lipid Panel No results for input(s): CHOL, HDL, LDLCALC, TRIG, CHOLHDL, LDLDIRECT in the last 72 hours. Thyroid Function Tests No results for input(s): TSH, T4TOTAL, T3FREE, THYROIDAB in the last 72 hours.  Invalid input(s): FREET3 _____________  CARDIAC CATHETERIZATION Result Date: 11/15/2023 Images from the original result were not included.   Lesion #1 mid LAD lesion is 70% stenosed. 1st Diag lesion is 65% stenosed.  (Sidebranch is very small caliber)   A drug-eluting stent was successfully placed covering the 70% LAD lesion and crossing sidebranch, using a STENT SYNERGY XD 2.50X16.  Stent postdilated to 2.8 mm. Post intervention, there is a 0% residual stenosis with stable 60% stenosis in the sidebranch.  TIMI-3 flow maintained..   Dist RCA lesion is 40% stenosed.   Lesion  Segment #2 RPDA-1 lesion is 60% stenosed with 70% stenosed small side branch in Inf Sept. RPDA-2 lesion is 80% stenosed.   A drug-eluting stent was successfully placed covering the 60 and 80% lesions in the PDA, using a STENT SYNERGY XD 2.50X48-deployed to 2.65 mm..   Post intervention, there is a 0% residual stenosis in the PDA-both lesions..  Post intervention, the side branch was stable at 70% residual stenosis.  TIMI-3 flow maintained.   -------------------------------------------------------   Post intervention, the side branch was reduced to 70% residual stenosis.   The left ventricular systolic function is normal. The left ventricular ejection fraction is 55-65% by visual estimate.   LV end diastolic pressure is normal.  There is no  aortic valve stenosis. Diagnostic Dominance: Right    Intervention    Two-Vessel CAD: Mid LAD focal eccentric 70% stenosis at a bend (RFR 0.89 and significant positive by [facility]), very small caliber (1.5 mm) diagonal branch with moderate proximal disease-too small for PCI. Successful DES PCI of mid LAD 70% reduced to 0%: Synergy XD 2.5 x 16 mm stent; postdilated to 2.8 mm. TIMI-3 flow maintained Mild diffuse disease in the large dominant RCA with long wraparound PDA that has a mid 80% stenosis preceded by diffuse 60% stenosis at takeoff of a major septal perforator branch that also has moderate to severe disease. (Both FFR positive by CT) Successful DES PCI of a long segment of the PDA covering the upstream 60% stenosis in the 80% stenosis reducing to 0%: Synergy XD 2.5 mm x 48 mm deployed to 2.65 mm. TIMI-3 flow maintained; no notable ostial stenosis of the septal perforator. Large-caliber LCx that gives rise to a small caliber RI branch and gives off AV groove branch that terminates as a small LPL, the main LCx terminates as a lateral OM 3 after giving rise to a small OM 2-minimal disease. Normal LV size and function with normal EDP. RECOMMENDATIONS   In the absence of any other  complications or medical issues, we expect the patient to be ready for discharge from an interventional cardiology perspective on 11/15/2023.   Due to significant contrast burden, will hydrate for 6 hours post PCI with same-day discharge. Blood pressure is borderline, will hold off on initiation of ARB or CCB.  Would avoid beta-blocker avoid fatigue. Continue high-dose statin   Recommend uninterrupted dual antiplatelet therapy with Aspirin  81mg  daily and Clopidogrel  75mg  daily for a minimum of 6 months (stable ischemic heart disease-Class I recommendation).   After 74-month DAPT, would stop aspirin  and continue Plavix  (interruptible) for long-term maintenance coverage given the extensive stent in the PDA and LAD. Joshua MICAEL Clay, MD, MS Joshua [name], M.D., M.S. Interventional Cardiologist [facility] Pager # [phone]  CT CORONARY MORPH W/CTA COR W/SCORE W/CA W/CM &/OR WO/CM Addendum Date: 11/09/2023 ADDENDUM REPORT: 11/09/2023 16:28 EXAM: OVER-READ INTERPRETATION  CT CHEST The following report is an over-read performed by radiologist Dr. Vanetta Mortimer [facility] on 11/09/2023. This over-read does not include interpretation of cardiac or coronary anatomy or pathology. The coronary CTA interpretation by the cardiologist is attached. COMPARISON:  Chest CT dated 09/28/2023. FINDINGS: The visualized thoracic aorta and central pulmonary arteries appear unremarkable. No adenopathy noted. The visualized esophagus is grossly unremarkable. No consolidative changes in the visualized lungs. Scattered calcified pleural plaques, likely sequela prior asbestos exposure. Fatty liver. No acute osseous pathology. IMPRESSION: 1. No acute extracardiac pathology. 2. Fatty liver. Electronically Signed   By: Vanetta Chou M.D.   On: 11/09/2023 16:28   Result Date: 11/09/2023 CLINICAL DATA:  Chest pain EXAM: Cardiac/Coronary CTA TECHNIQUE: A non-contrast, gated CT scan was obtained with axial slices of  2.5 mm through the heart for calcium  scoring. Calcium  scoring was performed using the Agatston method. A 120 kV prospective, gated, contrast cardiac CT scan was obtained. Gantry rotation speed was 230 msec and collimation was 0.63 mm. Two sublingual nitroglycerin  tablets (0.8 mg) were given. The 3D data set was reconstructed with motion correction for the best systolic or diastolic phase. Images were analyzed on a dedicated workstation using MPR, MIP, and VRT modes. The patient received 95 cc of contrast. FINDINGS: Image quality: Excellent. Noise artifact is: Limited. Coronary Arteries:  Normal coronary origin.  Right dominance. Left main: The left main is a large caliber vessel with a normal take off from the left coronary cusp that bifurcates to form a left anterior descending artery and a left circumflex artery. There is no plaque or stenosis. Left anterior descending artery: The LAD has extensive diffuse mixed plaque though with up to moderate stenosis 80 in the proximal/mid vessel at the bifurcation of first diagonal branch. FFR analysis shows hemodynamic significance throughout the vessel, 0.88 just prior to diagonal branch, 0.72 distal to diagonal branch, 0.64 distal LAD. First diagonal branch FFR 0.74 in the mid segment. Ramus intermedius: Patent with no evidence of plaque or stenosis. Left circumflex artery: The LCX is non-dominant and patent with extensive mixed plaque throughout vessel, 50-69% FFR does not indicate any significant flow limitation. The LCX gives off 2 patent obtuse marginal branches. Right coronary artery: The RCA is dominant with normal take off from the right coronary cusp. There is severe mixed plaque throughout vessel. In the distal RCA at the distal posterior lateral and PDA branch there is abnormal FFR of 0.5 consistent with flow limitation. Right Atrium: Right atrial size is within normal limits. Right Ventricle: The right ventricular cavity is within normal limits. Left Atrium:  Left atrial size is normal in size with no left atrial appendage filling defect. Left Ventricle: The ventricular cavity size is within normal limits. Pulmonary arteries: Normal in size. Pulmonary veins: Normal pulmonary venous drainage. Pericardium: Normal thickness without significant effusion or calcium  present. Cardiac valves: The aortic valve is trileaflet with minimal aortic valve calcification. The mitral valve is normal without significant calcification. Aorta: Normal caliber without significant disease. Extra-cardiac findings: See attached radiology report for non-cardiac structures. IMPRESSION: 1. Coronary calcium  score of 1942. This was 58 percentile for age-, sex, and race-matched controls. 2. Total plaque volume 2661 mm3 which is 100 percentile for age- and sex-matched controls (calcified plaque 528 mm3; non-calcified plaque 2133 mm3). TPV is (extensive). 3. Normal coronary origin with right dominance. 4. Extensive mixed plaque throughout all vessels with moderate to severe stenosis. FFR analysis shows proximal/mid LAD flow limitation, distal RCA/PDA/posterolateral branch flow limitation. 5.  Recommend cardiac catheterization Oneil Parchment, MD Electronically Signed: By: Oneil Parchment M.D. On: 11/09/2023 08:40   CT CORONARY FRACTIONAL FLOW RESERVE FLUID ANALYSIS Result Date: 11/09/2023 EXAM: FFRCT ANALYSIS FINDINGS: FFRct analysis was performed on the original cardiac CT angiogram dataset. Diagrammatic representation of the FFRct analysis is provided in a separate PDF document in PACS. This dictation was created using the PDF document and an interactive 3D model of the results. 3D model is not available in the EMR/PACS. Normal FFR range is >0.80. 1. Left Main: Normal 2. LAD: Abnormal, 0.91 proximal, 0.72 mid post first diagonal. First diagonal branch 0.74 proximally. 3. LCX: Normal, 0.81 distal first OM 4. Ramus: Not applicable 5. RCA: Abnormal, 0.5 PDA, posterolateral branch IMPRESSION: 1. Abnormal flow  limitation in the mid LAD, distal RCA region. Recommend cardiac catheterization. Note: These examples are not recommendations of [employer] and only provided as examples of what other customers are doing. Electronically Signed   By: Oneil Parchment M.D.   On: 11/09/2023 08:43    Disposition   Pt is being discharged home today in good condition.  Follow-up Plans & Appointments     Discharge Instructions     Amb Referral to Cardiac Rehabilitation   Complete by: As directed    Diagnosis:  Coronary Stents PTCA     After initial evaluation and assessments completed: Virtual  Based Care may be provided alone or in conjunction with Phase 2 Cardiac Rehab based on patient barriers.: Yes   Intensive Cardiac Rehabilitation (ICR) [facility] location only OR Traditional Cardiac Rehabilitation (TCR) *If criteria for ICR are not met will enroll in TCR [facility] only): Yes        Discharge Medications   Allergies as of 11/15/2023       Reactions   Penicillins    Unknown childhood reaction bu per pt Has taken Penicillins as an adult with no reaction        Medication List     TAKE these medications    albuterol  108 (90 Base) MCG/ACT inhaler Commonly known as: VENTOLIN  HFA SMARTSIG:1-2 Puff(s) By Mouth Every 4 Hours PRN What changed: See the new instructions.   aspirin  EC 81 MG tablet Take 1 tablet (81 mg total) by mouth daily.   atorvastatin  40 MG tablet Commonly known as: LIPITOR Take 1 tablet (40 mg total) by mouth daily.   clopidogrel  75 MG tablet Commonly known as: Plavix  Take 1 tablet (75 mg total) by mouth daily.   nitroGLYCERIN  0.4 MG SL tablet Commonly known as: Nitrostat  Place 1 tablet (0.4 mg total) under the tongue every 5 (five) minutes as needed.   testosterone  cypionate 200 MG/ML injection Commonly known as: DEPOTESTOSTERONE CYPIONATE Inject 200 mg into the muscle every 14 (fourteen) days.   traZODone  100 MG tablet Commonly known as: DESYREL  Take 200 mg by mouth at  bedtime.        Allergies Allergies  Allergen Reactions   Penicillins     Unknown childhood reaction bu per pt Has taken Penicillins as an adult with no reaction    Outstanding Labs/Studies   N/a   Duration of Discharge Encounter   Greater than 30 minutes including physician time.  Signed, Manuelita Rummer, NP 11/15/2023, 1:05 PM

## 2023-11-16 ENCOUNTER — Telehealth: Payer: Self-pay | Admitting: Physician Assistant

## 2023-11-16 ENCOUNTER — Encounter (HOSPITAL_COMMUNITY): Payer: Self-pay | Admitting: Cardiology

## 2023-11-16 NOTE — Telephone Encounter (Signed)
 No I do not have reservations

## 2023-11-16 NOTE — Telephone Encounter (Signed)
 Spoke with pt regarding his questions. Pt is wondering if he should be taking aspirin  and plavix . Pt is also wondering if he can eat hot peppers and when he can drink beer. Pt also stated he has a work note for the doctor to sign. Pt was told to bring in the note to be signed. Pt was told his questions would be forwarded to [name], PA-C and his covering for their suggestions.

## 2023-11-16 NOTE — Telephone Encounter (Signed)
 Called patient left message on personal voice mail to call back.

## 2023-11-16 NOTE — Telephone Encounter (Signed)
 Spoke to patient Joshua [name]'s advice given.Stated he was given a letter saying ok to return to work 8/3.Joshua [name] advised needs to return to work after visit with him.He wanted to ask Joshua [name] if he can return to work 8/3.Advised I will send message to him.

## 2023-11-16 NOTE — Telephone Encounter (Signed)
 Pt has several question about aftercare and would like a nurse to give him a call.

## 2023-11-16 NOTE — Telephone Encounter (Signed)
 Yes, he should take both aspirin  and Clopidogrel . These are taken together to keep the stents open.  He can eat hot peppers.  He should hold off on drinking beer for now. We can discuss at follow up.  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 Therefore, he should remain out of work until I see him back for follow up.   [name], PA-C    11/16/2023 4:07 PM

## 2023-11-17 ENCOUNTER — Telehealth: Payer: Self-pay | Admitting: Cardiology

## 2023-11-17 NOTE — Telephone Encounter (Signed)
 I discussed with Dr. Anner. He may return to work 8/3. [name], PA-C    11/17/2023 12:29 PM

## 2023-11-17 NOTE — Telephone Encounter (Signed)
 Pt dropped off FMLA paperwork + doctor's note to be completed.  Paperwork left in Dr. Genice mailbox.  Pt completed ROI form (left in envelope on Leanne's desk in MR office on 1st floor). Unable to locate Billing form - will need completion by patient once available. Pt aware of $29 fee (cash, check, or money order).   JB, 11-17-23

## 2023-11-17 NOTE — Telephone Encounter (Signed)
 Spoke to patient he stated he wants to return to work 11/22/23.He will bring a form to office this afternoon for Scott to sign tomorrow.Advised I will send message to nurse working with Glendia tomorrow.

## 2023-11-18 NOTE — Telephone Encounter (Signed)
 Patient called to follow-up on the status of his FMLA paperwork and doctor's note.

## 2023-11-20 ENCOUNTER — Telehealth: Payer: Self-pay | Admitting: Student

## 2023-11-20 NOTE — Telephone Encounter (Signed)
   The patient called the after-hours line reporting bruising along his arm today. This occurred after carrying a large 20 lb box. No swelling along the site. Does report having fatigue since stent placement but no chest pain. We reviewed that bruising is common after the procedure and especially since being on DAPT with ASA and Plavix . Recommended to continue to monitor for swelling at the site. He is unsure if he will be able to return to work on 11/22/2023 as he previously planned to do and I recommended he reach out to the office tomorrow if this needs this to be adjusted (appears letter and FMLA paperwork are pending).  Signed, Laymon CHRISTELLA Qua, PA-C 11/20/2023, 12:33 PM Pager: [phone]

## 2023-11-21 DIAGNOSIS — Z0279 Encounter for issue of other medical certificate: Secondary | ICD-10-CM

## 2023-11-21 NOTE — Telephone Encounter (Signed)
 Pt called back and stated that when he is just walking up a hill, he is getting exhausted.SABRA He stated he carried about 30lb to his basement and he felt shortness of breath and bruised his arm that they done the cath in.  Pt also wants to know when he should return to work?  Hospital told him 8/5 but pt thought Glendia said not until he sees him back. Pt aware will find out from [place] and get back with him tomorrow.

## 2023-11-21 NOTE — Telephone Encounter (Signed)
 See mychart encounter that is open.

## 2023-11-21 NOTE — Telephone Encounter (Signed)
 Spoke with pt, he is wanting to wait until after his echo and being seen on 12/12/23 before returning to work. He reports he carried a 30 lb box downstairs, his arm bruised and he was wiped out. He wants the [facility] paperwork and note for work to be after 12/12/23. 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

## 2023-11-21 NOTE — Telephone Encounter (Signed)
Patient returned staff call. 

## 2023-11-21 NOTE — Telephone Encounter (Signed)
Call placed to pt, left a message for him to call back.

## 2023-11-21 NOTE — Telephone Encounter (Signed)
 Pt requesting a c/b in regards to his FMLA status and his return to work note.

## 2023-11-22 ENCOUNTER — Encounter: Payer: Self-pay | Admitting: *Deleted

## 2023-11-22 ENCOUNTER — Telehealth (HOSPITAL_COMMUNITY): Payer: Self-pay

## 2023-11-22 NOTE — Telephone Encounter (Signed)
 Pt doesn't seem interested in the cardiac rehab program. He stated that he will call back in a few days with his decision. I advised pt to give us  a call.   Closed referral

## 2023-11-22 NOTE — Telephone Encounter (Signed)
 Call placed to pt.    Return to work note has been sent to [employer].

## 2023-11-23 NOTE — Telephone Encounter (Signed)
 FMLA forms completed. [name], PA-C    11/23/2023 5:40 PM

## 2023-11-24 NOTE — Telephone Encounter (Signed)
 FMLA form faxed to [employer] and scanned to chart. Billing and patient notified.

## 2023-11-25 LAB — HELIX PHARMACOGENOMICS (PGX) CLOPIDOGREL TEST

## 2023-11-29 ENCOUNTER — Ambulatory Visit: Admitting: Physician Assistant

## 2023-12-05 ENCOUNTER — Telehealth: Payer: Self-pay | Admitting: Internal Medicine

## 2023-12-05 ENCOUNTER — Ambulatory Visit (HOSPITAL_COMMUNITY)
Admission: RE | Admit: 2023-12-05 | Discharge: 2023-12-05 | Disposition: A | Discharge status: Home / Self Care | Source: Ambulatory Visit | Attending: Physician Assistant | Admitting: Physician Assistant

## 2023-12-05 DIAGNOSIS — R0602 Shortness of breath: Secondary | ICD-10-CM | POA: Insufficient documentation

## 2023-12-05 DIAGNOSIS — R072 Precordial pain: Secondary | ICD-10-CM | POA: Diagnosis present

## 2023-12-05 LAB — ECHOCARDIOGRAM COMPLETE: S' Lateral: 2.74 cm

## 2023-12-05 NOTE — Telephone Encounter (Signed)
Spoke with pt, aware of the recommendations. Follow up scheduled

## 2023-12-05 NOTE — Telephone Encounter (Signed)
 Spoke with pt, he reports swelling in his feet and legs for about 1 week now. He reports they are swollen in the morning and get worse with any time on his feet. He reports SOB with taking a shower, little exertion, he denies orthopnea. He does report the SOB is about the same as he had prior to the recent cath. The swelling is new. Hew did have an echocardiogram this morning. He was on vacation last week but both legs are swollen. He has a follow up appointment 12/12/23. Worthy will forward to dr wendel to review.

## 2023-12-05 NOTE — Telephone Encounter (Signed)
 Patient is here wanting to speak to nurse about new symptoms. He has stated feet are swollen and has recently started feeling fluttering in the heart and has fallen down as well. He wanted to check in with a nurse to make sure it everything was ok. He is willing to wait to speak to nurse.

## 2023-12-06 NOTE — Progress Notes (Unsigned)
 Cardiology Office Note:   Date:  12/07/2023  ID:  Joshua [name], DOB [birth year], MRN [id number] PCP:  Nanci Senior, MD  [facility] Providers Cardiologist:  Wendel Haws, MD Referring MD: Nanci Senior, MD  Chief Complaint/Reason for Referral: Lower extremity edema and palpitations ASSESSMENT:    1. Peripheral edema   2. Coronary artery disease involving native coronary artery of native heart without angina pectoris   3. Diastolic dysfunction   4. Hyperlipidemia LDL goal <70   5. Aortic atherosclerosis (HCC)   6. CKD (chronic kidney disease) stage 2, GFR 60-89 ml/min   7. BMI 38.0-38.9,adult     PLAN:   In order of problems listed above: Edema:  Due to trauma and salt intake, monitor for now Coronary artery disease: Continue aspirin  81 mg, Plavix  75 mg, atorvastatin  40 mg.  Can stop aspirin  in January 2026 and continue Plavix  monotherapy thereafter.  Refer to cardiac rehab. Diastolic dysfunction: Consider ARB in the future if BP elevated.  Dyspnea likely due to 30lb weight gain over last few years. Hyperlipidemia: Continue atorvastatin  40.  Check lipid panel, LFTs, and LP(a) today Aortic atherosclerosis: Continue aspirin  81 mg, atorvastatin  40 mg CKD stage II: Consider ARB in future Elevated BMI: Check hemoglobin A1c; 3 years ago it was 5.2.            Dispo:  Return in about 6 months (around 06/08/2024).       I spent 33 minutes reviewing all clinical data during and prior to this visit including all relevant imaging studies, laboratories, clinical information from other health systems and prior notes from both Cardiology and other specialties, interviewing the patient, conducting a complete physical examination, and coordinating care in order to formulate a comprehensive and personalized evaluation and treatment plan.   History of Present Illness:    FOCUSED PROBLEM LIST:   Coronary artery disease Angina >> PCI mLAD and PDA July 2025 Plavix  responder   Diastolic dysfunction Mild LVH, no significant valve issues, EF 55 to 60% TTE August 2025 Aortic atherosclerosis Hyperlipidemia (elevated Trigs) Intol of Rosuva due to myalgias Polycythemia  Low testosterone   CKD stage II GFR 71 BMI 38  August 2025:  Patient consents to use of AI scribe. The patient returns for expedited follow-up due to lower extremity edema.  The patient most recently underwent PCI of the mid LAD due to angina despite medical therapy.  This was an uncomplicated procedure.  He reached out to our office yesterday due to palpitations and lower extremity edema.  The patient was in [facility] over the last couple of weeks.  He tells me that he fell and injured his left ankle.  He was concerned that maybe he knocked his stents loose.  He noticed some swelling around that ankle.  He tells me he is eating a lot of salty foods and noticed some swelling of his right ankle however this was not as bad as his left.  He has chronic shortness of breath especially when he goes upstairs.  On review of his records it looks like he has gained approximately 30 pounds since 2021.  He was surprised to hear this.  He denies any exertional chest pain, presyncope, or syncope.  He has been compliant with his medications.     Current Medications: Current Meds  Medication Sig   albuterol  (VENTOLIN  HFA) 108 (90 Base) MCG/ACT inhaler SMARTSIG:1-2 Puff(s) By Mouth Every 4 Hours PRN (Patient taking differently: Inhale 1-2 puffs into the lungs as needed for wheezing  or shortness of breath.)   aspirin  EC 81 MG tablet Take 1 tablet (81 mg total) by mouth daily.   atorvastatin  (LIPITOR) 40 MG tablet Take 1 tablet (40 mg total) by mouth daily.   clopidogrel  (PLAVIX ) 75 MG tablet Take 1 tablet (75 mg total) by mouth daily.   nitroGLYCERIN  (NITROSTAT ) 0.4 MG SL tablet Place 1 tablet (0.4 mg total) under the tongue every 5 (five) minutes as needed.   testosterone  cypionate (DEPOTESTOSTERONE CYPIONATE) 200  MG/ML injection Inject 200 mg into the muscle every 14 (fourteen) days.   traZODone  (DESYREL ) 100 MG tablet Take 200 mg by mouth at bedtime.     Review of Systems:   Please see the history of present illness.    All other systems reviewed and are negative.     EKGs/Labs/Other Test Reviewed:   EKG: 2025 normal sinus rhythm  EKG Interpretation Date/Time:    Ventricular Rate:    PR Interval:    QRS Duration:    QT Interval:    QTC Calculation:   R Axis:      Text Interpretation:          CARDIAC STUDIES: Refer to CV Procedures and Imaging Tabs   Risk Assessment/Calculations:          Physical Exam:   VS:  BP 125/70   Pulse 90   Ht 5' 10 (1.778 m)   Wt 271 lb 9.6 oz (123.2 kg)   SpO2 94%   BMI 38.97 kg/m        Wt Readings from Last 3 Encounters:  12/07/23 271 lb 9.6 oz (123.2 kg)  11/15/23 270 lb (122.5 kg)  10/31/23 266 lb (120.7 kg)      GENERAL:  No apparent distress, AOx3 HEENT:  No carotid bruits, +2 carotid impulses, no scleral icterus CAR: RRR no murmurs, gallops, rubs, or thrills RES:  Clear to auscultation bilaterally ABD:  Soft, nontender, nondistended, positive bowel sounds x 4 VASC:  +2 radial pulses, +2 carotid pulses NEURO:  CN 2-12 grossly intact; motor and sensory grossly intact PSYCH:  No active depression or anxiety EXT: Mild swelling around abrasion of left ankle  Signed, Gertude Benito K Tereso Unangst, MD  12/07/2023 3:44 PM    [facility] [address] [place], [place], [place]  [address] Phone: [phone]; Fax: [phone]   Note:  This document was prepared using Dragon voice recognition software and may include unintentional dictation errors.

## 2023-12-07 ENCOUNTER — Encounter: Payer: Self-pay | Admitting: Internal Medicine

## 2023-12-07 ENCOUNTER — Ambulatory Visit: Attending: Cardiology | Admitting: Internal Medicine

## 2023-12-07 ENCOUNTER — Other Ambulatory Visit: Payer: Self-pay

## 2023-12-07 VITALS — BP 125/70 | HR 90 | Ht 70.0 in | Wt 271.6 lb

## 2023-12-07 DIAGNOSIS — Z6838 Body mass index (BMI) 38.0-38.9, adult: Secondary | ICD-10-CM

## 2023-12-07 DIAGNOSIS — I5189 Other ill-defined heart diseases: Secondary | ICD-10-CM

## 2023-12-07 DIAGNOSIS — I700 Atherosclerosis of aorta: Secondary | ICD-10-CM

## 2023-12-07 DIAGNOSIS — E785 Hyperlipidemia, unspecified: Secondary | ICD-10-CM

## 2023-12-07 DIAGNOSIS — R600 Localized edema: Secondary | ICD-10-CM

## 2023-12-07 DIAGNOSIS — N182 Chronic kidney disease, stage 2 (mild): Secondary | ICD-10-CM

## 2023-12-07 DIAGNOSIS — R002 Palpitations: Secondary | ICD-10-CM

## 2023-12-07 DIAGNOSIS — I2510 Atherosclerotic heart disease of native coronary artery without angina pectoris: Secondary | ICD-10-CM | POA: Diagnosis not present

## 2023-12-07 LAB — LIPID PANEL W/O CHOL/HDL RATIO

## 2023-12-07 NOTE — Addendum Note (Signed)
 Addended by: DRENA MARTINIS, Neera Teng L on: 12/07/2023 04:12 PM   Modules accepted: Orders

## 2023-12-07 NOTE — Patient Instructions (Addendum)
 Medication Instructions:  Your physician recommends that you continue on your current medications as directed. Please refer to the Current Medication list given to you today.  *If you need a refill on your cardiac medications before your next appointment, please call your pharmacy*  Lab Work: Today:  Lipid profile, lipoproteinA, LFTs & A1C  If you have any lab test that is abnormal or we need to change your treatment, we will call you to review the results.  Testing/Procedures: None ordered  Follow-Up: At [facility], you and your health needs are our priority.  As part of our continuing mission to provide you with exceptional heart care, our providers are all part of one team.  This team includes your primary Cardiologist (physician) and Advanced Practice Providers or APPs (Physician Assistants and Nurse Practitioners) who all work together to provide you with the care you need, when you need it.   You have been referred to Cardiac Rehab.   Your next appointment:   6 month(s)  Provider:   [name]   Thank you for choosing [facility]!!   [phone]

## 2023-12-08 ENCOUNTER — Other Ambulatory Visit (HOSPITAL_COMMUNITY): Payer: Self-pay

## 2023-12-08 ENCOUNTER — Ambulatory Visit: Payer: Self-pay | Admitting: Internal Medicine

## 2023-12-08 LAB — HEMOGLOBIN A1C
Est. average glucose Bld gHb Est-mCnc: 100 mg/dL
Hgb A1c MFr Bld: 5.1 % (ref 4.8–5.6)

## 2023-12-08 LAB — LIPID PANEL W/O CHOL/HDL RATIO
Cholesterol, Total: 134 mg/dL (ref 100–199)
HDL: 37 mg/dL — AB (ref >39)
LDL Chol Calc (NIH): 45 mg/dL (ref 0–99)
Triglycerides: 343 mg/dL — AB (ref 0–149)
VLDL Cholesterol Cal: 52 mg/dL — AB (ref 5–40)

## 2023-12-08 LAB — HEPATIC FUNCTION PANEL
ALT: 37 IU/L (ref 0–44)
AST: 31 IU/L (ref 0–40)
Albumin: 4.5 g/dL (ref 3.8–4.9)
Alkaline Phosphatase: 59 IU/L (ref 44–121)
Bilirubin Total: 0.6 mg/dL (ref 0.0–1.2)
Bilirubin, Direct: 0.21 mg/dL (ref 0.00–0.40)
Total Protein: 6.7 g/dL (ref 6.0–8.5)

## 2023-12-08 LAB — LIPOPROTEIN A (LPA): Lipoprotein (a): 16.6 nmol/L (ref <75.0)

## 2023-12-12 ENCOUNTER — Telehealth: Payer: Self-pay | Admitting: Cardiology

## 2023-12-12 ENCOUNTER — Ambulatory Visit: Admitting: Physician Assistant

## 2023-12-12 DIAGNOSIS — Z0279 Encounter for issue of other medical certificate: Secondary | ICD-10-CM

## 2023-12-12 NOTE — Telephone Encounter (Signed)
 Patient called [employer] and found out that we do need to complete the Short Term Disability form.  Patient signed release of information and paid the $29 fee.  Form in [employer].

## 2023-12-12 NOTE — Telephone Encounter (Signed)
 Pt dropped off job release paperwork so he can go back to work. Pt stated he needs it filled out and faxed back in today so he can return back to work asap.   Location: Dr. Anner mailbox.

## 2023-12-12 NOTE — Telephone Encounter (Signed)
 Received FMLA form from [employer] and placed in [employer].  A [employer] disability form was faxed to us  today.  Patient is calling [employer] to find out if they need that form completed.

## 2023-12-13 ENCOUNTER — Ambulatory Visit: Admitting: Physician Assistant

## 2023-12-13 ENCOUNTER — Telehealth (HOSPITAL_COMMUNITY): Payer: Self-pay

## 2023-12-13 NOTE — Telephone Encounter (Signed)
 Returned call to pt.  Pt has been made aware that we just received the forms this morning and when we get them completed we will fax them.  He thanked me for the call back.

## 2023-12-13 NOTE — Telephone Encounter (Signed)
 Attempted to verify insurance benefits through [facility]- no visit limits were listed on [employer], called number on card. Spoke to [place], he stated he could not give benefits because this is a specific [employer] plan. He gave me the number to call for [employer]. Called the [phone] number to speak to [employer], spoke with Ruby, they stated they could not give me benefits for cardiac rehab and transferred me back to [facility] who could not help me.  Unable to verify insurance benefits at this time beyond what is listed on [employer].

## 2023-12-13 NOTE — Telephone Encounter (Signed)
 Called patient to go over cardiac rehab, patient is interested but unable to schedule at this time due to his work schedule. He stated his schedule will be changing soon and he will call us  to schedule when it does. Informed patient we will close his referral for now and he can call us  to reopen it.  Closing referral.

## 2023-12-13 NOTE — Telephone Encounter (Signed)
 Patient called to follow-up on the status of his letter and stated he will need a response before 5:00 pm today as he needs to get back to work.

## 2023-12-14 NOTE — Telephone Encounter (Signed)
 Forms completed [name], PA-C    12/14/2023 8:07 AM

## 2023-12-14 NOTE — Telephone Encounter (Signed)
 Both the [employer] account planner for Duty and [employer] forms were faxed and scanned into chart. Billing notified.

## 2023-12-15 ENCOUNTER — Telehealth: Payer: Self-pay | Admitting: Internal Medicine

## 2023-12-15 ENCOUNTER — Encounter: Payer: Self-pay | Admitting: Internal Medicine

## 2023-12-15 DIAGNOSIS — I2510 Atherosclerotic heart disease of native coronary artery without angina pectoris: Secondary | ICD-10-CM

## 2023-12-15 DIAGNOSIS — I700 Atherosclerosis of aorta: Secondary | ICD-10-CM

## 2023-12-15 NOTE — Telephone Encounter (Signed)
 Patient called back to say that once we received the paperwork that he has to be filled out by the dr that did the surgery. Patient ask that we give him a call once we received it. Please advise

## 2023-12-15 NOTE — Telephone Encounter (Signed)
 Patient stated he received paperwork from his [employer] office that he will need to have his cardiologist keep on file and will be sending document in MyChart.

## 2023-12-16 NOTE — Telephone Encounter (Signed)
 Pt called in asking if the PDF that is attached can be filled out. She asked if it can be sent back through mychart.

## 2023-12-17 ENCOUNTER — Encounter: Payer: Self-pay | Admitting: Internal Medicine

## 2023-12-26 ENCOUNTER — Encounter: Payer: Self-pay | Admitting: Internal Medicine

## 2023-12-26 ENCOUNTER — Telehealth: Payer: Self-pay | Admitting: Internal Medicine

## 2023-12-26 NOTE — Telephone Encounter (Signed)
 Called and patient reported left foot edema and bruised around side and heel of foot since last Friday and reports cramps in belly. Denies shortness of breath. Denies weight gain. Patient reports wearing  compression stocking for first time yesterday. Educated patient on the morning and take off at bedtime. Advise patient to continue to elevate legs. Patient reducing sodium content. Patient reports fatigue is getting better and sleeping better. Patient made aware this will be forward to provider for advice and follow up with Primary Care Provider.

## 2023-12-26 NOTE — Telephone Encounter (Signed)
 Called and made patient aware per Dr. Wendel recommendations to continue current maneuvers and if swelling  does not improve Ultrasound may needed of left lower extremity to be ordered tom rule out DVT. Patient denies warmth or tenderness to left lower extremity. Made patient aware to continue to wear Ted hose, reduce sodium, and elevate legs. Made patient aware to notified office if swelling in left lower extremity continue to swell or remains unchanged with current recommendations. Understanding verbalized.

## 2023-12-26 NOTE — Telephone Encounter (Signed)
 Pt c/o swelling/edema: STAT if pt has developed SOB within 24 hours  If swelling, where is the swelling located?  Feet  How much weight have you gained and in what time span? No  Have you gained 2 pounds in a day or 5 pounds in a week?   No  Do you have a log of your daily weights (if so, list)?   No  Are you currently taking a fluid pill?   No  Are you currently SOB?   No  Have you traveled recently in a car or plane for an extended period of time?   Patient stated he has been wearing compression socks and now has a purple bruise on his left foot on inside.  Patient noted he gets fatigued which is getting better.  Patient stated he also sent picture in MyChart.

## 2024-01-03 ENCOUNTER — Telehealth: Payer: Self-pay | Admitting: Cardiology
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
## 2024-01-10 NOTE — Telephone Encounter (Signed)
 My apologies all for the delay on submitting this request. I have faxed it over to [phone] today. I will call the patient and explain the mixup, so sorry for this.

## 2024-02-01 ENCOUNTER — Other Ambulatory Visit (HOSPITAL_COMMUNITY): Payer: Self-pay

## 2024-03-09 ENCOUNTER — Other Ambulatory Visit (HOSPITAL_COMMUNITY): Payer: Self-pay

## 2024-03-12 ENCOUNTER — Ambulatory Visit (HOSPITAL_COMMUNITY)
Admission: RE | Admit: 2024-03-12 | Discharge: 2024-03-12 | Disposition: A | Discharge status: Home / Self Care | Source: Ambulatory Visit | Attending: Internal Medicine | Admitting: Internal Medicine

## 2024-03-12 ENCOUNTER — Ambulatory Visit: Payer: Self-pay | Admitting: Internal Medicine

## 2024-03-12 DIAGNOSIS — I2510 Atherosclerotic heart disease of native coronary artery without angina pectoris: Secondary | ICD-10-CM | POA: Insufficient documentation

## 2024-03-12 DIAGNOSIS — I700 Atherosclerosis of aorta: Secondary | ICD-10-CM | POA: Diagnosis present

## 2024-03-12 LAB — ECHOCARDIOGRAM COMPLETE
AR max vel: 3.06 cm2
AV Area VTI: 2.89 cm2
AV Area mean vel: 2.81 cm2
AV Mean grad: 5 mmHg
AV Peak grad: 8.8 mmHg
Ao pk vel: 1.48 m/s
Area-P 1/2: 4.6 cm2
S' Lateral: 2.6 cm

## 2024-05-02 ENCOUNTER — Other Ambulatory Visit: Payer: Self-pay | Admitting: Physician Assistant

## 2024-05-25 ENCOUNTER — Encounter (HOSPITAL_COMMUNITY): Payer: Self-pay

## 2024-05-25 ENCOUNTER — Other Ambulatory Visit (HOSPITAL_COMMUNITY): Payer: Self-pay | Admitting: *Deleted

## 2024-05-25 DIAGNOSIS — Z01810 Encounter for preprocedural cardiovascular examination: Secondary | ICD-10-CM

## 2024-05-29 ENCOUNTER — Other Ambulatory Visit (HOSPITAL_COMMUNITY)

## 2024-08-06 ENCOUNTER — Ambulatory Visit: Admitting: Physician Assistant
